# Patient Record
Sex: Female | Born: 1940 | Race: Black or African American | Hispanic: No | Marital: Single | Smoking: Former smoker
Health system: Southern US, Community
[De-identification: ages and names within clinical notes are randomized; demographics above are authoritative.]

## PROBLEM LIST (undated history)

## (undated) DIAGNOSIS — I2510 Atherosclerotic heart disease of native coronary artery without angina pectoris: Secondary | ICD-10-CM

## (undated) DIAGNOSIS — I10 Essential (primary) hypertension: Secondary | ICD-10-CM

## (undated) DIAGNOSIS — I639 Cerebral infarction, unspecified: Secondary | ICD-10-CM

---

## 2017-07-11 DIAGNOSIS — I10 Essential (primary) hypertension: Secondary | ICD-10-CM | POA: Diagnosis present

## 2017-07-11 DIAGNOSIS — I639 Cerebral infarction, unspecified: Secondary | ICD-10-CM | POA: Insufficient documentation

## 2017-07-11 DIAGNOSIS — Z8673 Personal history of transient ischemic attack (TIA), 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 residual deficits: Secondary | ICD-10-CM | POA: Insufficient documentation

## 2017-07-11 DIAGNOSIS — E785 Hyperlipidemia, unspecified: Secondary | ICD-10-CM | POA: Insufficient documentation

## 2017-12-04 DIAGNOSIS — Z87891 Personal history of nicotine dependence: Secondary | ICD-10-CM | POA: Insufficient documentation

## 2018-03-20 ENCOUNTER — Emergency Department: Payer: Medicare Other

## 2018-03-20 ENCOUNTER — Other Ambulatory Visit: Payer: Self-pay

## 2018-03-20 ENCOUNTER — Emergency Department
Admission: EM | Admit: 2018-03-20 | Discharge: 2018-03-20 | Disposition: A | Discharge status: Home / Self Care | Payer: Medicare Other | Attending: Internal Medicine | Admitting: Internal Medicine

## 2018-03-20 ENCOUNTER — Encounter: Payer: Self-pay | Admitting: Emergency Medicine

## 2018-03-20 DIAGNOSIS — I10 Essential (primary) hypertension: Secondary | ICD-10-CM | POA: Diagnosis not present

## 2018-03-20 DIAGNOSIS — R001 Bradycardia, unspecified: Secondary | ICD-10-CM | POA: Insufficient documentation

## 2018-03-20 DIAGNOSIS — Z79899 Other long term (current) drug therapy: Secondary | ICD-10-CM | POA: Insufficient documentation

## 2018-03-20 DIAGNOSIS — Z8673 Personal history of transient ischemic attack (TIA), 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 residual deficits: Secondary | ICD-10-CM | POA: Insufficient documentation

## 2018-03-20 DIAGNOSIS — Z87891 Personal history of nicotine dependence: Secondary | ICD-10-CM | POA: Diagnosis not present

## 2018-03-20 DIAGNOSIS — I2510 Atherosclerotic heart disease of native coronary artery without angina pectoris: Secondary | ICD-10-CM | POA: Diagnosis not present

## 2018-03-20 DIAGNOSIS — Z7982 Long term (current) use of aspirin: Secondary | ICD-10-CM | POA: Insufficient documentation

## 2018-03-20 HISTORY — DX: Atherosclerotic heart disease of native coronary artery without angina pectoris: I25.10

## 2018-03-20 HISTORY — DX: Cerebral infarction, unspecified: I63.9

## 2018-03-20 HISTORY — DX: Essential (primary) hypertension: I10

## 2018-03-20 LAB — CBC
HCT: 33.7 % — ABNORMAL LOW (ref 36.0–46.0)
Hemoglobin: 11.2 g/dL — ABNORMAL LOW (ref 12.0–15.0)
MCH: 29.3 pg (ref 26.0–34.0)
MCHC: 33.2 g/dL (ref 30.0–36.0)
MCV: 88.2 fL (ref 80.0–100.0)
NRBC: 0 % (ref 0.0–0.2)
Platelets: 240 10*3/uL (ref 150–400)
RBC: 3.82 MIL/uL — ABNORMAL LOW (ref 3.87–5.11)
RDW: 17 % — ABNORMAL HIGH (ref 11.5–15.5)
WBC: 4.7 10*3/uL (ref 4.0–10.5)

## 2018-03-20 LAB — URINALYSIS, COMPLETE (UACMP) WITH MICROSCOPIC
Bilirubin Urine: NEGATIVE
GLUCOSE, UA: NEGATIVE mg/dL
Hgb urine dipstick: NEGATIVE
Ketones, ur: NEGATIVE mg/dL
Leukocytes,Ua: NEGATIVE
Nitrite: NEGATIVE
PROTEIN: NEGATIVE mg/dL
Specific Gravity, Urine: 1.025 (ref 1.005–1.030)
pH: 6 (ref 5.0–8.0)

## 2018-03-20 LAB — BASIC METABOLIC PANEL
ANION GAP: 4 — AB (ref 5–15)
BUN: 19 mg/dL (ref 8–23)
CO2: 28 mmol/L (ref 22–32)
Calcium: 9.6 mg/dL (ref 8.9–10.3)
Chloride: 108 mmol/L (ref 98–111)
Creatinine, Ser: 1.02 mg/dL — ABNORMAL HIGH (ref 0.44–1.00)
GFR calc Af Amer: >60 mL/min (ref >60)
GFR calc non Af Amer: 52 mL/min — ABNORMAL LOW (ref >60)
Glucose, Bld: 117 mg/dL — ABNORMAL HIGH (ref 70–99)
Potassium: 3.7 mmol/L (ref 3.5–5.1)
Sodium: 140 mmol/L (ref 135–145)

## 2018-03-20 LAB — TSH: TSH: 1.096 u[IU]/mL (ref 0.350–4.500)

## 2018-03-20 LAB — TROPONIN I: Troponin I: <0.03 ng/mL (ref <0.03)

## 2018-03-20 NOTE — ED Triage Notes (Addendum)
Pt in via POV from [facility], family reports bradycardia and hypotension over the last two days, PCP advised to be seen here for possible echocardiogram.  Pt bradycardic upon arrival, other vitals WDL.  Pt denies any weakness, dizziness, or shortness of breath, also denies any pain.  NAD noted at this time.

## 2018-03-20 NOTE — ED Notes (Signed)
First nurse note: pt referred to ED by R. Smith PCP for bradycardia (40s, sinus brady per MD) and new-onset confusion.

## 2018-03-20 NOTE — ED Notes (Signed)
Urine specimen obtained and sent. Vss. Patient denies pain/sob. Awaiting disposition. Family at bedside.

## 2018-03-20 NOTE — ED Notes (Signed)
TSH added on to blood work in lab.

## 2018-03-20 NOTE — ED Notes (Signed)
Urine culture sent to lab along with UA

## 2018-03-20 NOTE — ED Notes (Signed)
Labs drawn in triage and resulted. patient placed on cardiac monitor showing brady cardia rate of 46 with inverted t waves. Denies pain/sob. Awaiting md plan of care. Family at bedside.n

## 2018-03-20 NOTE — ED Provider Notes (Signed)
[facility] Emergency Department Provider Note  ____________________________________________   I have reviewed the triage vital signs and the nursing notes. Where available I have reviewed prior notes and, if possible and indicated, outside hospital notes.    HISTORY  Chief Complaint Bradycardia    HPI Sara Bailey is a 78 y.o. female presents today complaining of "I feel fine".  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  She is not on any beta-blockers and I have called the nursing home to verify that.  She is here because she has a low heart rate.  Family has no concerns about how she is acting.  They thought her blood pressure was "either high or low or something" at the facility.  However, I did call the facility, and the nurse there told me her blood pressure was 145, and the only concern was that the stages noticed of bradycardia.  Patient has been at this facility only for a few days and her heart rate apparently has been low that entire time.  She does not have any chest pain shortness of breath nausea or vomiting, she is acting normally now respect she has a normal level of energy, she does not feel in any way unwell or ill, and she is adamant that she does not want to stay in the hospital.  There is a note that says she possibly had some confusion, asked patient and family about this.  They state that she has not been confused.  They state that there are 2 patients aides who have the same or similar name and she called 1 by the wrong name but otherwise there is been no confusion. All in the room agreed that she is at her baseline at this time.  Past Medical History:  Diagnosis Date  . Coronary artery disease   . Hypertension   . Stroke [facility])     There are no active problems to display for this patient.     Prior to Admission medications   Medication  Sig Start Date End Date Taking? Authorizing Provider  amLODipine (NORVASC) 5 MG tablet Take 5 mg by mouth daily. 03/05/18  Yes [provider]  aspirin 81 MG chewable tablet Chew 81 mg by mouth daily. 07/14/17 07/14/18 Yes [provider]  atorvastatin (LIPITOR) 80 MG tablet Take 80 mg by mouth daily. 03/05/18  Yes [provider]  hydrochlorothiazide (MICROZIDE) 12.5 MG capsule Take 12.5 mg by mouth daily. 02/05/18  Yes [provider]  levothyroxine (SYNTHROID, LEVOTHROID) 75 MCG tablet Take 75 mcg by mouth daily. 03/04/18  Yes [provider]  Melatonin 3 MG TABS Take 3 mg by mouth at bedtime.   Yes [provider]    Allergies Patient has no known allergies.  No family history on file.  Social History Social History   Tobacco Use  . Smoking status: Former Games developer  . Smokeless tobacco: Never Used  Substance Use Topics  . Alcohol use: Never    Frequency: Never  . Drug use: Never    Review of Systems Constitutional: No fever/chills Eyes: No visual changes. ENT: No sore throat. No stiff neck no neck pain Cardiovascular: Denies chest pain. Respiratory: Denies shortness of breath. Gastrointestinal:   no vomiting.  No diarrhea.  No constipation. Genitourinary: Negative for dysuria. Musculoskeletal: Negative lower extremity swelling Skin: Negative for rash. Neurological: Negative for severe headaches, focal weakness  or numbness.   ____________________________________________   PHYSICAL EXAM:  VITAL SIGNS: ED Triage Vitals  Enc Vitals Group     BP 03/20/18 1447 (!) 116/50     Pulse Rate 03/20/18 1447 (!) 53     Resp 03/20/18 1447 18     Temp 03/20/18 1447 98.2 F (36.8 C)     Temp Source 03/20/18 1447 Oral     SpO2 03/20/18 1447 100 %     Weight 03/20/18 1448 146 lb (66.2 kg)     Height 03/20/18 1448 5\' 6"  (1.676 m)     Head Circumference --      Peak Flow --      Pain Score 03/20/18 1456 0     Pain Loc --      Pain  Edu? --      Excl. in GC? --     Constitutional: Alert and oriented. Well appearing and in no acute distress. Eyes: Conjunctivae are normal Head: Atraumatic HEENT: No congestion/rhinnorhea. Mucous membranes are moist.  Oropharynx non-erythematous Neck:   Nontender with no meningismus, no masses, no stridor Cardiovascular: Bradycardia noted regular rhythm. Grossly normal heart sounds.  Good peripheral circulation. Respiratory: Normal respiratory effort.  No retractions. Lungs CTAB. Abdominal: Soft and nontender. No distention. No guarding no rebound Back:  There is no focal tenderness or step off.  there is no midline tenderness there are no lesions noted. there is no CVA tenderness Musculoskeletal: No lower extremity tenderness, no upper extremity tenderness. No joint effusions, no DVT signs strong distal pulses no edema Neurologic:  Normal speech and language. No gross focal neurologic deficits are appreciated.  Skin:  Skin is warm, dry and intact. No rash noted. Psychiatric: Mood and affect are normal. Speech and behavior are normal.  ____________________________________________   LABS (all labs ordered are listed, but only abnormal results are displayed)  Labs Reviewed  BASIC METABOLIC PANEL - Abnormal; Notable for the following components:      Result Value   Glucose, Bld 117 (*)    Creatinine, Ser 1.02 (*)    GFR calc non Af Amer 52 (*)    Anion gap 4 (*)    All other components within normal limits  CBC - Abnormal; Notable for the following components:   RBC 3.82 (*)    Hemoglobin 11.2 (*)    HCT 33.7 (*)    RDW 17.0 (*)    All other components within normal limits  TROPONIN I  TSH  URINALYSIS, COMPLETE (UACMP) WITH MICROSCOPIC  TROPONIN I    Pertinent labs  results that were available during my care of the patient were reviewed by me and considered in my medical decision making (see chart for details). ____________________________________________  EKG  I  personally interpreted any EKGs ordered by me or triage Bradycardia, diffuse ST changes, flipped T waves noted inferiorly and laterally no ST elevation, no ST depression, no old for comparison. ____________________________________________  RADIOLOGY  Pertinent labs & imaging results that were available during my care of the patient were reviewed by me and considered in my medical decision making (see chart for details). If possible, patient and/or family made aware of any abnormal findings.  No results found. ____________________________________________    PROCEDURES  Procedure(s) performed: None  Procedures  Critical Care performed: None  ____________________________________________   INITIAL IMPRESSION / ASSESSMENT AND PLAN / ED COURSE  Pertinent labs & imaging results that were available during my care of the patient were reviewed by me and considered in  my medical decision making (see chart for details).  Has had asymptomatic bradycardia for an unknown amount of time.  I have called the facility there is no chance in their opinion that she is taking the wrong medications as they are giving her her own medications that she brought with him to the facility.  She is new to this facility and they just noticed her heart rate being low since she got there.  She does not know what her heart rate was like before.  There is no other records available.  I did look through multiple different physical therapy notes going back to December but unfortunately they are not helpful.  The patient is in no acute distress and has no complaints.  I have offered and even suggested admission to the hospital even call the hospitalist to admit her because of her bradycardia of unknown etiology, and she refuses family are also in agreement that she should not be admitted.  I do not think this is unreasonable given that she has no symptoms and we have no idea how long this is been going on for but I did call  cardiology to check and see if they agree.  I talked to Dr. Lady GaryFath.  As usual I very much appreciate consult.  He states that at this time given asymptomatic bradycardia even with EKG findings and no old EKG for comparison, that they likely can see her tomorrow as an outpatient.  He does agree with thyroid testing repeat troponin prior to discharge.  We will do that.  Patient consents therefore to stay for the rest of her testing.  She has no complaints and her pressures been holding steady despite her heart rate.  In addition, I have told her to hold her blood pressure medication until she sees a cardiologist tomorrow.    ____________________________________________   FINAL CLINICAL IMPRESSION(S) / ED DIAGNOSES  Final diagnoses:  None      This chart was dictated using voice recognition software.  Despite best efforts to proofread,  errors can occur which can change meaning.      Jeanmarie PlantMcShane, James A, MD 03/20/18 1757

## 2018-03-20 NOTE — Discharge Instructions (Addendum)
You would prefer not to be admitted to the hospital which is certainly her choice but does limit our ability to watch you, as we have discussed.  For this reason, we asked that you be vigilant about your health, if you have chest pain, shortness of breath, you feel lightheaded or other concerns return to the emergency room.  Otherwise, we have talked to the cardiologist, and they will see you tomorrow morning.  Please call their office first thing in the morning for an appointment they should see you in late morning.  If you change your mind or feel worse in the meantime please return to the ER.  We would consider that you should stop taking her blood pressure medications, the amlodipine and the HCTZ, until you the heart doctor tomorrow.

## 2018-03-20 NOTE — ED Notes (Signed)
Awaiting chest xray

## 2018-03-22 LAB — URINE CULTURE: CULTURE: NO GROWTH

## 2018-06-15 ENCOUNTER — Encounter: Payer: Self-pay | Admitting: Emergency Medicine

## 2018-06-15 ENCOUNTER — Emergency Department
Admission: EM | Admit: 2018-06-15 | Discharge: 2018-06-15 | Disposition: A | Discharge status: Nursing Home (Includes ICF) | Payer: Medicare Other | Source: Home / Self Care | Attending: Emergency Medicine | Admitting: Emergency Medicine

## 2018-06-15 ENCOUNTER — Emergency Department: Payer: Medicare Other

## 2018-06-15 ENCOUNTER — Other Ambulatory Visit: Payer: Self-pay

## 2018-06-15 DIAGNOSIS — W19XXXA Unspecified fall, initial encounter: Secondary | ICD-10-CM | POA: Insufficient documentation

## 2018-06-15 DIAGNOSIS — I10 Essential (primary) hypertension: Secondary | ICD-10-CM | POA: Insufficient documentation

## 2018-06-15 DIAGNOSIS — I2510 Atherosclerotic heart disease of native coronary artery without angina pectoris: Secondary | ICD-10-CM | POA: Insufficient documentation

## 2018-06-15 DIAGNOSIS — R410 Disorientation, unspecified: Secondary | ICD-10-CM

## 2018-06-15 DIAGNOSIS — Z8673 Personal history of transient ischemic attack (TIA), 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 residual deficits: Secondary | ICD-10-CM | POA: Insufficient documentation

## 2018-06-15 DIAGNOSIS — Z79899 Other long term (current) drug therapy: Secondary | ICD-10-CM | POA: Insufficient documentation

## 2018-06-15 DIAGNOSIS — Z87891 Personal history of nicotine dependence: Secondary | ICD-10-CM | POA: Insufficient documentation

## 2018-06-15 DIAGNOSIS — E860 Dehydration: Secondary | ICD-10-CM | POA: Diagnosis not present

## 2018-06-15 LAB — COMPREHENSIVE METABOLIC PANEL
ALT: 21 U/L (ref 0–44)
AST: 22 U/L (ref 15–41)
Albumin: 4.2 g/dL (ref 3.5–5.0)
Alkaline Phosphatase: 84 U/L (ref 38–126)
Anion gap: 10 (ref 5–15)
BUN: 17 mg/dL (ref 8–23)
CO2: 27 mmol/L (ref 22–32)
Calcium: 10.4 mg/dL — ABNORMAL HIGH (ref 8.9–10.3)
Chloride: 102 mmol/L (ref 98–111)
Creatinine, Ser: 1.06 mg/dL — ABNORMAL HIGH (ref 0.44–1.00)
GFR calc Af Amer: 57 mL/min — ABNORMAL LOW (ref >60)
GFR calc non Af Amer: 50 mL/min — ABNORMAL LOW (ref >60)
Glucose, Bld: 117 mg/dL — ABNORMAL HIGH (ref 70–99)
Potassium: 3.3 mmol/L — ABNORMAL LOW (ref 3.5–5.1)
Sodium: 139 mmol/L (ref 135–145)
Total Bilirubin: 0.7 mg/dL (ref 0.3–1.2)
Total Protein: 7.9 g/dL (ref 6.5–8.1)

## 2018-06-15 LAB — CBC
HCT: 41 % (ref 36.0–46.0)
Hemoglobin: 13.7 g/dL (ref 12.0–15.0)
MCH: 29 pg (ref 26.0–34.0)
MCHC: 33.4 g/dL (ref 30.0–36.0)
MCV: 86.7 fL (ref 80.0–100.0)
Platelets: 263 10*3/uL (ref 150–400)
RBC: 4.73 MIL/uL (ref 3.87–5.11)
RDW: 15.5 % (ref 11.5–15.5)
WBC: 5.1 10*3/uL (ref 4.0–10.5)
nRBC: 0 % (ref 0.0–0.2)

## 2018-06-15 LAB — URINALYSIS, COMPLETE (UACMP) WITH MICROSCOPIC
Bilirubin Urine: NEGATIVE
Glucose, UA: NEGATIVE mg/dL
Hgb urine dipstick: NEGATIVE
Ketones, ur: NEGATIVE mg/dL
Nitrite: NEGATIVE
Protein, ur: NEGATIVE mg/dL
Specific Gravity, Urine: 1.015 (ref 1.005–1.030)
pH: 6 (ref 5.0–8.0)

## 2018-06-15 LAB — GLUCOSE, CAPILLARY: Glucose-Capillary: 71 mg/dL (ref 70–99)

## 2018-06-15 NOTE — ED Notes (Signed)
Patient's brother called, patient does not have key to apartment. Brother says door should be open but he cannot come unlock. EMS speaking with brother now

## 2018-06-15 NOTE — ED Notes (Signed)
Per Drinda Butts, patient's brother called and stated that he was not coming to get the patient and she needs to go back by ambulance. Ambulance was called. Patient has been informed.

## 2018-06-15 NOTE — ED Notes (Signed)
Left voicemail on Ben's cell (POA) about patients discharge

## 2018-06-15 NOTE — ED Provider Notes (Signed)
[facility] Emergency Department Provider Note    ____________________________________________   I have reviewed the triage vital signs and the nursing notes.   HISTORY  Chief Complaint Fall and Altered Mental Status   History limited by and level 5 caveat due to: dementia   HPI Sara Bailey is a 78 y.o. female who presents to the emergency department today because of concern for confusion and fall. The patient herself is unsure why she is in the emergency department. She does admit to having a fall yesterday. She says that she tripped. She denies any lightheadedness. She denies any chest pain or palpitations. She says that she has not had any recent illness. Denies any fever. No change in urination.   Records reviewed. Per medical record review patient has a history of CAD, HTN, stroke.   Past Medical History:  Diagnosis Date  . Coronary artery disease   . Hypertension   . Stroke [facility])     There are no active problems to display for this patient.   History reviewed. No pertinent surgical history.  Prior to Admission medications   Medication Sig Start Date End Date Taking? Authorizing Provider  amLODipine (NORVASC) 5 MG tablet Take 5 mg by mouth daily. 03/05/18   [provider]  aspirin 81 MG chewable tablet Chew 81 mg by mouth daily. 07/14/17 07/14/18  [provider]  atorvastatin (LIPITOR) 80 MG tablet Take 80 mg by mouth daily. 03/05/18   [provider]  hydrochlorothiazide (MICROZIDE) 12.5 MG capsule Take 12.5 mg by mouth daily. 02/05/18   [provider]  levothyroxine (SYNTHROID, LEVOTHROID) 75 MCG tablet Take 75 mcg by mouth daily. 03/04/18   [provider]  Melatonin 3 MG TABS Take 3 mg by mouth at bedtime.    [provider]    Allergies Patient has no known allergies.  No family history on file.  Social History Social History   Tobacco Use  . Smoking status: Former Games developermoker  .  Smokeless tobacco: Never Used  Substance Use Topics  . Alcohol use: Never    Frequency: Never  . Drug use: Never    Review of Systems Constitutional: No fever/chills Eyes: No visual changes. ENT: No sore throat. Cardiovascular: Denies chest pain. Respiratory: Denies shortness of breath. Gastrointestinal: No abdominal pain.  No nausea, no vomiting.  No diarrhea.   Genitourinary: Negative for dysuria. Musculoskeletal: Negative for back pain. Skin: Negative for rash. Neurological: Negative for headaches, focal weakness or numbness.  ____________________________________________   PHYSICAL EXAM:  VITAL SIGNS: ED Triage Vitals [06/15/18 1310]  Enc Vitals Group     BP (!) 147/89     Pulse Rate (!) 57     Resp 16     Temp 98.2 F (36.8 C)     Temp Source Oral     SpO2 97 %     Weight 158 lb (71.7 kg)     Height 5\' 6"  (1.676 m)     Head Circumference      Peak Flow      Pain Score 0   Constitutional: Alert and oriented.  Eyes: Conjunctivae are normal.  ENT      Head: Normocephalic and atraumatic.      Nose: No congestion/rhinnorhea.      Mouth/Throat: Mucous membranes are moist.      Neck: No stridor. Hematological/Lymphatic/Immunilogical: No cervical lymphadenopathy. Cardiovascular: Normal rate, regular rhythm.  No murmurs, rubs, or gallops. Respiratory: Normal respiratory effort without tachypnea nor retractions. Breath  sounds are clear and equal bilaterally. No wheezes/rales/rhonchi. Gastrointestinal: Soft and non tender. No rebound. No guarding.  Genitourinary: Deferred Musculoskeletal: Normal range of motion in all extremities. No lower extremity edema. Neurologic:  Normal speech and language. Not completely aware of events. No gross focal neurologic deficits are appreciated.  Skin:  Skin is warm, dry and intact. No rash noted. Psychiatric: Mood and affect are normal. Speech and behavior are normal. Patient exhibits appropriate insight and  judgment.  ____________________________________________    LABS (pertinent positives/negatives)  CBC wbc 5.1, hgb 13.7, plt 263 CMP na 139, k 3.3, glu 117, cr 1.06 ____________________________________________   EKG  I, Phineas Semen, attending physician, personally viewed and interpreted this EKG  EKG Time: 1547 Rate: 61 Rhythm: sinus rhythm Axis: left axis deviation Intervals: qtc 523 QRS: RBBB, LVH ST changes: no st elevation Impression: abnormal ekg   ____________________________________________    RADIOLOGY  CT head No acute abnormality  ____________________________________________   PROCEDURES  Procedures  ____________________________________________   INITIAL IMPRESSION / ASSESSMENT AND PLAN / ED COURSE  Pertinent labs & imaging results that were available during my care of the patient were reviewed by me and considered in my medical decision making (see chart for details).   Patient presented to the emergency department today after falling concerns for some confusion.  On exam here patient is somewhat disoriented to why she is at the hospital.  Head CT without any acute findings.  Patient is able to state clearly that she tripped during the fall.  Denied any chest pain or shortness of breath.  Denied any lightheadedness.  Patient's blood work without any concerning findings.  Patient's urine did have some trace leukocytes however no white blood cells.  Will send for culture.  This point do think is reasonable for patient to return to living facility. ____________________________________________   FINAL CLINICAL IMPRESSION(S) / ED DIAGNOSES  Final diagnoses:  Fall, initial encounter  Confusion     Note: This dictation was prepared with Dragon dictation. Any transcriptional errors that result from this process are unintentional     Phineas Semen, MD 06/15/18 [phone]

## 2018-06-15 NOTE — ED Notes (Signed)
Multiple attempts at calling [facility] with no success

## 2018-06-15 NOTE — Discharge Instructions (Addendum)
Please seek medical attention for any high fevers, chest pain, shortness of breath, change in behavior, persistent vomiting, bloody stool or any other new or concerning symptoms.  

## 2018-06-15 NOTE — ED Triage Notes (Signed)
Patient presents to the ED via EMS from [facility] for confusion.  EMS was called out by staff because patient seemed more confused than normal per staff and patient's brother.  Patient is alert and oriented to self and place at this time but not time.  Per EMS patient was not oriented to place or time earlier today.  Per Staff, they felt patient may have fallen during the night because her closet was "off it's tracks".  Patient reports falling yesterday, denies hitting her head.  Patient states, "I tripped and then I fell."

## 2018-06-17 ENCOUNTER — Other Ambulatory Visit: Payer: Self-pay

## 2018-06-17 ENCOUNTER — Emergency Department: Payer: Medicare Other

## 2018-06-17 ENCOUNTER — Inpatient Hospital Stay
Admission: EM | Admit: 2018-06-17 | Discharge: 2018-06-21 | DRG: 640 | Disposition: A | Discharge status: Skilled Nursing Facility | Payer: Medicare Other | Attending: Internal Medicine | Admitting: Internal Medicine

## 2018-06-17 DIAGNOSIS — Z9181 History of falling: Secondary | ICD-10-CM | POA: Diagnosis not present

## 2018-06-17 DIAGNOSIS — E782 Mixed hyperlipidemia: Secondary | ICD-10-CM | POA: Diagnosis present

## 2018-06-17 DIAGNOSIS — I129 Hypertensive chronic kidney disease with stage 1 through stage 4 chronic kidney disease, or unspecified chronic kidney disease: Secondary | ICD-10-CM | POA: Diagnosis present

## 2018-06-17 DIAGNOSIS — I248 Other forms of acute ischemic heart disease: Secondary | ICD-10-CM | POA: Diagnosis present

## 2018-06-17 DIAGNOSIS — N183 Chronic kidney disease, stage 3 (moderate): Secondary | ICD-10-CM | POA: Diagnosis present

## 2018-06-17 DIAGNOSIS — E876 Hypokalemia: Secondary | ICD-10-CM | POA: Diagnosis present

## 2018-06-17 DIAGNOSIS — G9341 Metabolic encephalopathy: Secondary | ICD-10-CM | POA: Diagnosis present

## 2018-06-17 DIAGNOSIS — E860 Dehydration: Secondary | ICD-10-CM | POA: Diagnosis present

## 2018-06-17 DIAGNOSIS — Z87891 Personal history of nicotine dependence: Secondary | ICD-10-CM

## 2018-06-17 DIAGNOSIS — R4182 Altered mental status, unspecified: Secondary | ICD-10-CM | POA: Diagnosis present

## 2018-06-17 DIAGNOSIS — I10 Essential (primary) hypertension: Secondary | ICD-10-CM

## 2018-06-17 DIAGNOSIS — I739 Peripheral vascular disease, unspecified: Secondary | ICD-10-CM | POA: Diagnosis present

## 2018-06-17 DIAGNOSIS — Z8673 Personal history of transient ischemic attack (TIA), 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 residual deficits: Secondary | ICD-10-CM

## 2018-06-17 DIAGNOSIS — F0390 Unspecified dementia without behavioral disturbance: Secondary | ICD-10-CM | POA: Diagnosis present

## 2018-06-17 DIAGNOSIS — I2510 Atherosclerotic heart disease of native coronary artery without angina pectoris: Secondary | ICD-10-CM | POA: Diagnosis present

## 2018-06-17 DIAGNOSIS — Z20828 Contact with and (suspected) exposure to other viral communicable diseases: Secondary | ICD-10-CM | POA: Diagnosis present

## 2018-06-17 DIAGNOSIS — Z23 Encounter for immunization: Secondary | ICD-10-CM

## 2018-06-17 DIAGNOSIS — E039 Hypothyroidism, unspecified: Secondary | ICD-10-CM | POA: Diagnosis present

## 2018-06-17 LAB — TSH: TSH: 1.927 u[IU]/mL (ref 0.350–4.500)

## 2018-06-17 LAB — URINALYSIS, COMPLETE (UACMP) WITH MICROSCOPIC
Bilirubin Urine: NEGATIVE
Glucose, UA: NEGATIVE mg/dL
Ketones, ur: 20 mg/dL — AB
Leukocytes,Ua: NEGATIVE
Nitrite: NEGATIVE
Protein, ur: 30 mg/dL — AB
Specific Gravity, Urine: 1.019 (ref 1.005–1.030)
Squamous Epithelial / HPF: NONE SEEN (ref 0–5)
pH: 5 (ref 5.0–8.0)

## 2018-06-17 LAB — COMPREHENSIVE METABOLIC PANEL
ALT: 18 U/L (ref 0–44)
AST: 26 U/L (ref 15–41)
Albumin: 4.4 g/dL (ref 3.5–5.0)
Alkaline Phosphatase: 112 U/L (ref 38–126)
Anion gap: 14 (ref 5–15)
BUN: 23 mg/dL (ref 8–23)
CO2: 26 mmol/L (ref 22–32)
Calcium: 10.9 mg/dL — ABNORMAL HIGH (ref 8.9–10.3)
Chloride: 106 mmol/L (ref 98–111)
Creatinine, Ser: 1.17 mg/dL — ABNORMAL HIGH (ref 0.44–1.00)
GFR calc Af Amer: 51 mL/min — ABNORMAL LOW (ref >60)
GFR calc non Af Amer: 44 mL/min — ABNORMAL LOW (ref >60)
Glucose, Bld: 119 mg/dL — ABNORMAL HIGH (ref 70–99)
Potassium: 3.4 mmol/L — ABNORMAL LOW (ref 3.5–5.1)
Sodium: 146 mmol/L — ABNORMAL HIGH (ref 135–145)
Total Bilirubin: 1.2 mg/dL (ref 0.3–1.2)
Total Protein: 8.2 g/dL — ABNORMAL HIGH (ref 6.5–8.1)

## 2018-06-17 LAB — CBC WITH DIFFERENTIAL/PLATELET
Abs Immature Granulocytes: 0.03 10*3/uL (ref 0.00–0.07)
Basophils Absolute: 0 10*3/uL (ref 0.0–0.1)
Basophils Relative: 0 %
Eosinophils Absolute: 0 10*3/uL (ref 0.0–0.5)
Eosinophils Relative: 0 %
HCT: 44 % (ref 36.0–46.0)
Hemoglobin: 14.7 g/dL (ref 12.0–15.0)
Immature Granulocytes: 0 %
Lymphocytes Relative: 14 %
Lymphs Abs: 1.5 10*3/uL (ref 0.7–4.0)
MCH: 28.9 pg (ref 26.0–34.0)
MCHC: 33.4 g/dL (ref 30.0–36.0)
MCV: 86.6 fL (ref 80.0–100.0)
Monocytes Absolute: 0.8 10*3/uL (ref 0.1–1.0)
Monocytes Relative: 7 %
Neutro Abs: 8.2 10*3/uL — ABNORMAL HIGH (ref 1.7–7.7)
Neutrophils Relative %: 79 %
Platelets: 236 10*3/uL (ref 150–400)
RBC: 5.08 MIL/uL (ref 3.87–5.11)
RDW: 15.6 % — ABNORMAL HIGH (ref 11.5–15.5)
WBC: 10.6 10*3/uL — ABNORMAL HIGH (ref 4.0–10.5)
nRBC: 0 % (ref 0.0–0.2)

## 2018-06-17 LAB — AMMONIA: Ammonia: <9 umol/L — ABNORMAL LOW (ref 9–35)

## 2018-06-17 LAB — URINE CULTURE: Culture: 50000 — AB

## 2018-06-17 LAB — FOLATE: Folate: 12.1 ng/mL (ref >5.9)

## 2018-06-17 LAB — TROPONIN I
Troponin I: 0.04 ng/mL (ref <0.03)
Troponin I: 0.07 ng/mL (ref <0.03)

## 2018-06-17 LAB — SARS CORONAVIRUS 2 BY RT PCR (HOSPITAL ORDER, PERFORMED IN ~~LOC~~ HOSPITAL LAB): SARS Coronavirus 2: NEGATIVE

## 2018-06-17 MED ORDER — HYDROCHLOROTHIAZIDE 25 MG PO TABS
25.0000 mg | ORAL_TABLET | Freq: Every day | ORAL | Status: DC
  Administered 2018-06-18 – 2018-06-21 (×4): 25 mg via ORAL
  Filled 2018-06-17 (×4): qty 1

## 2018-06-17 MED ORDER — ONDANSETRON HCL 4 MG/2ML IJ SOLN: 4.0000 mg | Freq: Four times a day (QID) | INTRAMUSCULAR | Status: DC | PRN

## 2018-06-17 MED ORDER — ATORVASTATIN CALCIUM 20 MG PO TABS
80.0000 mg | ORAL_TABLET | Freq: Every day | ORAL | Status: DC
  Administered 2018-06-18 – 2018-06-21 (×4): 80 mg via ORAL
  Filled 2018-06-17 (×4): qty 4

## 2018-06-17 MED ORDER — MELATONIN 5 MG PO TABS
5.0000 mg | ORAL_TABLET | Freq: Every day | ORAL | Status: DC
  Administered 2018-06-19 – 2018-06-20 (×2): 5 mg via ORAL
  Filled 2018-06-17 (×5): qty 1

## 2018-06-17 MED ORDER — SODIUM CHLORIDE 0.9 % IV BOLUS
1000.0000 mL | Freq: Once | INTRAVENOUS | Status: AC
  Administered 2018-06-17: 18:00:00 1000 mL via INTRAVENOUS

## 2018-06-17 MED ORDER — ONDANSETRON HCL 4 MG PO TABS: 4.0000 mg | ORAL_TABLET | Freq: Four times a day (QID) | ORAL | Status: DC | PRN

## 2018-06-17 MED ORDER — SODIUM CHLORIDE 0.45 % IV SOLN
INTRAVENOUS | Status: AC
  Administered 2018-06-17: 21:00:00 via INTRAVENOUS

## 2018-06-17 MED ORDER — SODIUM CHLORIDE 0.9 % IV SOLN: INTRAVENOUS | Status: DC

## 2018-06-17 MED ORDER — MIRTAZAPINE 15 MG PO TABS
7.5000 mg | ORAL_TABLET | Freq: Every day | ORAL | Status: DC
  Administered 2018-06-17 – 2018-06-20 (×4): 7.5 mg via ORAL
  Filled 2018-06-17 (×4): qty 1

## 2018-06-17 MED ORDER — POTASSIUM CHLORIDE 10 MEQ/100ML IV SOLN
10.0000 meq | INTRAVENOUS | Status: AC
  Administered 2018-06-17 – 2018-06-18 (×4): 10 meq via INTRAVENOUS
  Filled 2018-06-17 (×4): qty 100

## 2018-06-17 MED ORDER — ENOXAPARIN SODIUM 40 MG/0.4ML ~~LOC~~ SOLN
40.0000 mg | SUBCUTANEOUS | Status: DC
  Administered 2018-06-17 – 2018-06-20 (×4): 40 mg via SUBCUTANEOUS
  Filled 2018-06-17 (×4): qty 0.4

## 2018-06-17 MED ORDER — LEVOTHYROXINE SODIUM 50 MCG PO TABS
75.0000 ug | ORAL_TABLET | Freq: Every day | ORAL | Status: DC
  Administered 2018-06-18 – 2018-06-21 (×4): 75 ug via ORAL
  Filled 2018-06-17 (×4): qty 2

## 2018-06-17 MED ORDER — LATANOPROST 0.005 % OP SOLN
1.0000 [drp] | Freq: Every day | OPHTHALMIC | Status: DC
  Administered 2018-06-17 – 2018-06-20 (×4): 1 [drp] via OPHTHALMIC
  Filled 2018-06-17 (×2): qty 2.5

## 2018-06-17 MED ORDER — HYDRALAZINE HCL 20 MG/ML IJ SOLN
10.0000 mg | Freq: Once | INTRAMUSCULAR | Status: AC
  Administered 2018-06-17: 16:00:00 10 mg via INTRAVENOUS
  Filled 2018-06-17: qty 1

## 2018-06-17 MED ORDER — ACETAMINOPHEN 650 MG RE SUPP: 650.0000 mg | Freq: Four times a day (QID) | RECTAL | Status: DC | PRN

## 2018-06-17 MED ORDER — AMLODIPINE BESYLATE 5 MG PO TABS
10.0000 mg | ORAL_TABLET | Freq: Every day | ORAL | Status: DC
  Administered 2018-06-18 – 2018-06-21 (×4): 10 mg via ORAL
  Filled 2018-06-17 (×4): qty 2

## 2018-06-17 MED ORDER — HYDRALAZINE HCL 20 MG/ML IJ SOLN: 5.0000 mg | INTRAMUSCULAR | Status: DC | PRN

## 2018-06-17 MED ORDER — POTASSIUM CHLORIDE CRYS ER 20 MEQ PO TBCR
40.0000 meq | EXTENDED_RELEASE_TABLET | Freq: Once | ORAL | Status: DC
  Filled 2018-06-17: qty 2

## 2018-06-17 MED ORDER — POLYETHYLENE GLYCOL 3350 17 G PO PACK: 17.0000 g | PACK | Freq: Every day | ORAL | Status: DC | PRN

## 2018-06-17 MED ORDER — ASPIRIN 81 MG PO CHEW
81.0000 mg | CHEWABLE_TABLET | Freq: Every day | ORAL | Status: DC
  Administered 2018-06-18 – 2018-06-21 (×4): 81 mg via ORAL
  Filled 2018-06-17 (×4): qty 1

## 2018-06-17 MED ORDER — HYDRALAZINE HCL 20 MG/ML IJ SOLN
5.0000 mg | Freq: Once | INTRAMUSCULAR | Status: AC
  Administered 2018-06-17: 15:00:00 5 mg via INTRAVENOUS
  Filled 2018-06-17: qty 1

## 2018-06-17 MED ORDER — ACETAMINOPHEN 325 MG PO TABS: 650.0000 mg | ORAL_TABLET | Freq: Four times a day (QID) | ORAL | Status: DC | PRN

## 2018-06-17 NOTE — ED Notes (Signed)
Lab states they got the blood they needed

## 2018-06-17 NOTE — ED Notes (Addendum)
Informed that lab had called and stated they needed a redraw on green and purple top as they had hemolyzed- will redraw

## 2018-06-17 NOTE — ED Notes (Signed)
This RN called lab to see if IV team drew blood. Gwen from lab stated that she has not received any blood from IV team on this pt. Will attempt to draw blood off pt line that IV team started.

## 2018-06-17 NOTE — ED Notes (Signed)
Pt given yellow socks, yellow armband, door open and oriented to use call bell if needed

## 2018-06-17 NOTE — ED Notes (Signed)
Lab at bedside

## 2018-06-17 NOTE — Progress Notes (Signed)
Dr. Anne Hahn notified of Na+ 146; acknowledged; new order writtten for 0.45 NS. Windy Carina, RN 9:02 PM 06/17/2018

## 2018-06-17 NOTE — ED Notes (Signed)
Patient transported to CT 

## 2018-06-17 NOTE — ED Notes (Addendum)
Went to check on pt and pt had thrown up on herself and wet the bed- Jae Dire RN helped to get her clean and dry- new sheets and gown placed on pt- brief placed on pt

## 2018-06-17 NOTE — ED Notes (Addendum)
Talked to brother- he is concerned that the pt had a stroke even though it was explained to him that the CT scan did not show anything- he now wants her kept for observation and wishes to speak with the doctor- Dr Scotty Court notified and will speak with the brother

## 2018-06-17 NOTE — ED Notes (Signed)
Another RN attempted to draw blood x 2, unsuccessful. Called Brandy, Consulting civil engineer, who stated to call lab to have them come draw blood. This RN called lab and spoke to Parker.

## 2018-06-17 NOTE — ED Notes (Signed)
Carollee Herter RN attempted [place] IV insertion, unsuccessfull. Will wait for IV team to attempt.

## 2018-06-17 NOTE — ED Notes (Signed)
This RN called lab d/t blood being sent but not processed yet. Stated "we were waiting on a blue top." informed lab that test was cancelled. Lab stated they would run the labs now.

## 2018-06-17 NOTE — ED Notes (Signed)
Spoke with pt's brother and gave him an update on pt condition and care

## 2018-06-17 NOTE — ED Notes (Signed)
Jae Dire RN attempted x3 for an IV

## 2018-06-17 NOTE — Progress Notes (Signed)
Hospitalist paged for Na+ 146; patient on NS @ 75 ml/hour; awaiting callback. Windy Carina, RN 8:59 PM 06/17/2018

## 2018-06-17 NOTE — ED Provider Notes (Addendum)
[facility] Emergency Department Provider Note ____________________________________________   First MD Initiated Contact with Patient 06/17/18 1150     (approximate)  I have reviewed the triage vital signs and the nursing notes.   HISTORY  Chief Complaint Altered Mental Status  Level 5 caveat: History of present illness limited due to altered mental status  HPI Sara Bailey is a 78 y.o. female with PMH as noted below including CAD, hypertension, and stroke who presents with altered mental status.  Normally the patient is able to ambulate and talk, but over the last 2 days she has been behaving strangely and appearing confused.  The patient denies any acute pain and does not know why she is here.  Past Medical History:  Diagnosis Date  . Coronary artery disease   . Hypertension   . Stroke [facility])     There are no active problems to display for this patient.   History reviewed. No pertinent surgical history.  Prior to Admission medications   Medication Sig Start Date End Date Taking? Authorizing Provider  amLODipine (NORVASC) 5 MG tablet Take 5 mg by mouth daily. 03/05/18  Yes [provider]  aspirin 81 MG chewable tablet Chew 81 mg by mouth daily. 07/14/17 07/14/18 Yes [provider]  atorvastatin (LIPITOR) 80 MG tablet Take 80 mg by mouth daily. 03/05/18  Yes [provider]  hydrochlorothiazide (MICROZIDE) 12.5 MG capsule Take 12.5 mg by mouth daily. 02/05/18  Yes [provider]  latanoprost (XALATAN) 0.005 % ophthalmic solution Apply 1 drop to eye at bedtime. 01/12/18  Yes [provider]  levothyroxine (SYNTHROID, LEVOTHROID) 75 MCG tablet Take 75 mcg by mouth daily. 03/04/18  Yes [provider]  Melatonin 3 MG TABS Take 3 mg by mouth at bedtime.   Yes [provider]  mirtazapine (REMERON) 7.5 MG tablet Take 7.5 mg by mouth at bedtime.  06/14/18  Yes [provider]    Allergies  Patient has no known allergies.  No family history on file.  Social History Social History   Tobacco Use  . Smoking status: Former Games developer  . Smokeless tobacco: Never Used  Substance Use Topics  . Alcohol use: Never    Frequency: Never  . Drug use: Never    Review of Systems Level 5 caveat: Unable to obtain review of systems due to altered mental status    ____________________________________________   PHYSICAL EXAM:  VITAL SIGNS: ED Triage Vitals  Enc Vitals Group     BP 06/17/18 1126 (!) 211/97     Pulse Rate 06/17/18 1126 70     Resp 06/17/18 1126 17     Temp 06/17/18 1126 98.6 F (37 C)     Temp Source 06/17/18 1126 Oral     SpO2 06/17/18 1126 97 %     Weight 06/17/18 1127 125 lb (56.7 kg)     Height 06/17/18 1127 5\' 6"  (1.676 m)     Head Circumference --      Peak Flow --      Pain Score 06/17/18 1126 0     Pain Loc --      Pain Edu? --      Excl. in GC? --     Constitutional: Alert, oriented to person only.  Comfortable appearing.  Eyes: Conjunctivae are normal.  EOMI.  PERRLA. Head: Atraumatic. Nose: No congestion/rhinnorhea. Mouth/Throat: Mucous membranes are slightly dry.   Neck: Normal range of motion.  Cardiovascular: Normal rate, regular rhythm. Grossly normal  heart sounds.  Good peripheral circulation. Respiratory: Normal respiratory effort.  No retractions. Lungs CTAB. Gastrointestinal: Soft and nontender. No distention.  Genitourinary: No flank tenderness. Musculoskeletal: No lower extremity edema.  Extremities warm and well perfused.  Neurologic: Normal speech.  Motor intact in all extremities. Skin:  Skin is warm and dry. No rash noted. Psychiatric: Calm and cooperative.  ____________________________________________   LABS (all labs ordered are listed, but only abnormal results are displayed)  Labs Reviewed  URINALYSIS, COMPLETE (UACMP) WITH MICROSCOPIC - Abnormal; Notable for the following components:      Result Value   Color,  Urine YELLOW (*)    APPearance CLEAR (*)    Hgb urine dipstick SMALL (*)    Ketones, ur 20 (*)    Protein, ur 30 (*)    Bacteria, UA RARE (*)    All other components within normal limits  SARS CORONAVIRUS 2 (HOSPITAL ORDER, PERFORMED IN [facility])  CBC WITH DIFFERENTIAL/PLATELET  CBC WITH DIFFERENTIAL/PLATELET  COMPREHENSIVE METABOLIC PANEL  TROPONIN I  CBG MONITORING, ED   ____________________________________________  EKG  ED ECG REPORT I, Dionne BucySebastian Larren Copes, the attending physician, personally viewed and interpreted this ECG.  Date: 06/17/2018 EKG Time: 1354 Rate: 75 Rhythm: normal sinus rhythm with PVCs QRS Axis: normal Intervals: RBBB, repolarization abnormality ST/T Wave abnormalities: Nonspecific ST abnormalities Narrative Interpretation: Nonspecific abnormalities with evidence of acute ischemia  ____________________________________________  RADIOLOGY  CT head: No acute abnormality CXR: No focal infiltrate  ____________________________________________   PROCEDURES  Procedure(s) performed: No  Procedures  Critical Care performed: No ____________________________________________   INITIAL IMPRESSION / ASSESSMENT AND PLAN / ED COURSE  Pertinent labs & imaging results that were available during my care of the patient were reviewed by me and considered in my medical decision making (see chart for details).  78 year old female with PMH as noted above presents with altered mental status over the last 2 days.  I reviewed the past medical records in Epic.  The patient was seen in the ED on 06/15/2018 with confusion and a fall.  CT head at that time showed no acute abnormality.  On exam today, the patient's mental status is significantly worse than it was when she was here 2 days ago.  She is oriented to person only, and appears confused.  She is not really able to answer most of my questions.  Neuro exam is nonfocal.  There is no visible trauma.   The remainder of the exam is as described above.  Differential is broad but includes UTI or other infection, dehydration or other metabolic etiology, or possible ICH or other CNS cause.  Will obtain CT head, chest x-ray, lab work-up, UA, and reassess.  ----------------------------------------- 3:24 PM on 06/17/2018 -----------------------------------------  Lab work-up slightly delayed as the patient had very difficult IV access.  Most of her labs are still pending.  The patient's blood pressure is somewhat improved after labetalol although it continues to be elevated.  I am signing the patient out to the oncoming physician Dr. Scotty CourtStafford.  Anticipate she will likely need admission. ____________________________________________   FINAL CLINICAL IMPRESSION(S) / ED DIAGNOSES  Final diagnoses:  Altered mental status, unspecified altered mental status type  Hypertension, unspecified type      NEW MEDICATIONS STARTED DURING THIS VISIT:  New Prescriptions   No medications on file     Note:  This document was prepared using Dragon voice recognition software and may include unintentional dictation errors.    Dionne BucySiadecki, Sherrell Weir, MD 06/17/18 1524  Dionne Bucy, MD 06/17/18 1534

## 2018-06-17 NOTE — Progress Notes (Signed)
Patient doesn't open eyes when spoken to; slow to respond after repeated attempts at communication; stated her name only, but will not answer anymore questions, nor take anymore medications; shaking head when attempted po's and getting combative; Dr. Anne Hahn notified of refusal of KDur; acknowledged; new order written for IV potassium. Windy Carina, RN 10:44 PM 06/17/2018

## 2018-06-17 NOTE — H&P (Addendum)
[facility] - [place] [facility]   PATIENT NAME: Sara Bailey    MR#:  [id number]  DATE OF BIRTH:  [birth year]  DATE OF ADMISSION:  06/17/2018  PRIMARY CARE PHYSICIAN: [facility]   REQUESTING/REFERRING PHYSICIAN: Alfonse Flavors, MD  CHIEF COMPLAINT:   Chief Complaint  Patient presents with   Altered Mental Status    HISTORY OF PRESENT ILLNESS:  Sara Bailey  is a 78 y.o. female with a known history of CAD, hypertension, history of stroke who presented to the ED with altered mental status for the last 3 or 4 days.  Unable to obtain much history from the patient, so history provided by ED physician and family.  Patient currently living at independent living facility and has had inability to care for herself over the last couple of days due to altered mental status.  She denies any chest pain, shortness of breath, abdominal pain, fevers, chills.  In the ED, she was hypertensive with BP 213/110.  Labs were significant for K 3.4, tropes 0.04, WBC 10.6.  UA with 20 ketones.  Chest x-ray negative.  CT head negative.  COVID test was negative.  Hospitalists were called for admission.  PAST MEDICAL HISTORY:   Past Medical History:  Diagnosis Date   Coronary artery disease    Hypertension    Stroke [facility])     PAST SURGICAL HISTORY:  History reviewed. No pertinent surgical history.  SOCIAL HISTORY:   Social History   Tobacco Use   Smoking status: Former Smoker   Smokeless tobacco: Never Used  Substance Use Topics   Alcohol use: Never    Frequency: Never    FAMILY HISTORY:  Unable to obtain due to altered mental status  DRUG ALLERGIES:  No Known Allergies  REVIEW OF SYSTEMS:   ROS-unable to obtain due to altered mental status  MEDICATIONS AT HOME:   Prior to Admission medications   Medication Sig Start Date End Date Taking? Authorizing Provider  amLODipine (NORVASC) 5 MG tablet Take 5 mg by mouth daily. 03/05/18  Yes  [provider]  aspirin 81 MG chewable tablet Chew 81 mg by mouth daily. 07/14/17 07/14/18 Yes [provider]  atorvastatin (LIPITOR) 80 MG tablet Take 80 mg by mouth daily. 03/05/18  Yes [provider]  hydrochlorothiazide (MICROZIDE) 12.5 MG capsule Take 12.5 mg by mouth daily. 02/05/18  Yes [provider]  latanoprost (XALATAN) 0.005 % ophthalmic solution Apply 1 drop to eye at bedtime. 01/12/18  Yes [provider]  levothyroxine (SYNTHROID, LEVOTHROID) 75 MCG tablet Take 75 mcg by mouth daily. 03/04/18  Yes [provider]  Melatonin 3 MG TABS Take 3 mg by mouth at bedtime.   Yes [provider]  mirtazapine (REMERON) 7.5 MG tablet Take 7.5 mg by mouth at bedtime.  06/14/18  Yes [provider]      VITAL SIGNS:  Blood pressure (!) 144/85, pulse 91, temperature 98.6 F (37 C), temperature source Oral, resp. rate (!) 21, height 5\' 6"  (1.676 m), weight 56.7 kg, SpO2 96 %.  PHYSICAL EXAMINATION:  Physical Exam  GENERAL:  78 y.o.-year-old patient lying in the bed with no acute distress.  Thin appearing. EYES: Pupils equal, round, reactive to light and accommodation. No scleral icterus. Extraocular muscles intact.  HEENT: Head atraumatic, normocephalic. Oropharynx and nasopharynx clear.  NECK:  Supple, no jugular venous distention. No thyroid enlargement, no tenderness.  LUNGS: + Minutes breath sounds in lung bases bilaterally., no wheezing, rales,rhonchi  or crepitation. No use of accessory muscles of respiration.  CARDIOVASCULAR: RRR, S1, S2 normal. No murmurs, rubs, or gallops.  ABDOMEN: Soft, nontender, nondistended. Bowel sounds present. No organomegaly or mass.  EXTREMITIES: No pedal edema, cyanosis, or clubbing.  NEUROLOGIC: Cranial nerves II through XII are intact. + Global weakness. Sensation intact. Gait not checked.  PSYCHIATRIC: The patient is alert and oriented only to self. SKIN: No obvious rash, lesion, or  ulcer.   LABORATORY PANEL:   CBC Recent Labs  Lab 06/17/18 1545  WBC 10.6*  HGB 14.7  HCT 44.0  PLT 236   ------------------------------------------------------------------------------------------------------------------  Chemistries  Recent Labs  Lab 06/17/18 1545  NA 146*  K 3.4*  CL 106  CO2 26  GLUCOSE 119*  BUN 23  CREATININE 1.17*  CALCIUM 10.9*  AST 26  ALT 18  ALKPHOS 112  BILITOT 1.2   ------------------------------------------------------------------------------------------------------------------  Cardiac Enzymes Recent Labs  Lab 06/17/18 1545  TROPONINI 0.04*   ------------------------------------------------------------------------------------------------------------------  RADIOLOGY:  Ct Head Wo Contrast  Result Date: 06/17/2018 CLINICAL DATA:  Altered mental status EXAM: CT HEAD WITHOUT CONTRAST TECHNIQUE: Contiguous axial images were obtained from the base of the skull through the vertex without intravenous contrast. COMPARISON:  06/15/2018 FINDINGS: Brain: Similar findings of atrophy with sulcal prominence and centralized volume loss with commensurate ex vacuo dilatation of the ventricular system, left slightly greater than right. Nearly confluent periventricular hypodensities compatible with microvascular ischemic disease. Old lacunar infarct within the left basal ganglia. Given extensive background parenchymal abnormalities, there is no CT evidence superimposed acute large territory infarct. No intraparenchymal or extra-axial mass or hemorrhage. Unchanged size and configuration of the ventricles and the basilar cisterns. 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 No midline shift. Vascular: Intracranial atherosclerosis. Skull: No displaced calvarial fracture. Sinuses/Orbits: Scattered opacification of the left anterior ethmoidal air cells. The remaining paranasal sinuses and mastoid air cells are normally aerated. No air-fluid levels. Other:  Regional soft tissues appear normal. IMPRESSION: 1. Similar findings of atrophy and microvascular ischemic disease without superimposed acute intracranial process. 2. Sinus disease above.  No air-fluid levels. Electronically Signed   By: Simonne ComeJohn  Watts M.D.   On: 06/17/2018 13:25   Dg Chest Portable 1 View  Result Date: 06/17/2018 CLINICAL DATA:  Altered mental status. EXAM: PORTABLE CHEST 1 VIEW COMPARISON:  03/20/2018 FINDINGS: Grossly unchanged enlarged cardiac silhouette and mediastinal contours. Atherosclerotic plaque when the thoracic aorta. The lungs remain hyperexpanded with flattening the diaphragms mild diffuse slightly nodular thickening of the pulmonary interstitium. Bibasilar heterogeneous opacities are unchanged favored to represent atelectasis or scar. Unchanged symmetric biapical pleuroparenchymal thickening. No new focal airspace opacities. No pleural effusion or pneumothorax. No acute or aggressive osseous abnormalities. IMPRESSION: Similar findings of lung hyperexpansion and chronic bronchitic change without superimposed acute cardiopulmonary disease. Electronically Signed   By: Simonne ComeJohn  Watts M.D.   On: 06/17/2018 13:26      IMPRESSION AND PLAN:   Altered mental status- unclear etiology. May just be a worsening of her baseline dementia.  No signs of infection.  CT head negative. -Check HIV, RPR, TSH, ammonia, B12, folate to rule out other causes of AMS  Uncontrolled hypertension- BPs markedly elevated in the ED -Increase home Norvasc to 10 mg daily and HCTZ to 25 mg daily -Hydralazine IV as needed  Mildly elevated troponin- unlikely ACS. No active chest pain. -Trend troponins  Hypokalemia -Replete and recheck  Hypothyroidism-stable -Continue home Synthroid -Check TSH  CKD 3- creatinine close to baseline. -Avoid nephrotoxic agents -  Monitor  History of stroke- no focal neuro deficits to suggest new stroke -Continue aspirin and Lipitor   All the records are reviewed and  case discussed with ED provider. Management plans discussed with the patient, family and they are in agreement.  CODE STATUS: Full code for now- patient is altered and unable to get in touch with family. Need to readdress in the morning.  TOTAL TIME TAKING CARE OF THIS PATIENT: 45 minutes.    Jinny Blossom Cathleen Yagi M.D on 06/17/2018 at 6:01 PM  Between 7am to 6pm - Pager - [phone]  After 6pm go to [email or web address] - Social research officer, government  [facility]  Office  [phone]  CC: Primary care physician; [facility]   Note: This dictation was prepared with Dragon dictation along with smaller phrase technology. Any transcriptional errors that result from this process are unintentional.

## 2018-06-17 NOTE — ED Notes (Signed)
This RN attempted IV access. Veins blew. Was able to obtain light green and lavender tubes and sent to lab.

## 2018-06-17 NOTE — ED Triage Notes (Addendum)
Pt arrives via EMS from [facility] for altered mental status- was seen here 2 days ago after having a fall and some confusion- facility states she is normally wlaking around and talking appropriately but is now acting strange and are concerned about  UTI- pt does not remember her fall- pt is alert and oriented to self and place- EMS bp was 165/100- EMS reports pt was having weakness with walking and was not answering questions appropriately- CBG 130 per EMS

## 2018-06-17 NOTE — ED Provider Notes (Signed)
-----------------------------------------   5:19 PM on 06/17/2018 -----------------------------------------   Labs significant for evidence of dehydration with hemoconcentration and urinary ketones.  Troponin is slightly elevated at 0.04.  Not currently indicative of ACS.  Blood pressure improved with a second dose of IV hydralazine 10 mg.  Discussed with patient's brother who identifies himself as POA and notes that the patient has had acute worsening of her confusion and ability to care for herself in independent living environment over the past 3 or 4 days.  With evidence of dehydration and her uncontrolled hypertension, discussed with the hospitalist for further management for correction of these issues and hopefully improvement of her mental and functional status.   Final diagnoses:  Altered mental status, unspecified altered mental status type  Hypertension, unspecified type  Dehydration      Sharman Cheek, MD 06/17/18 1721

## 2018-06-17 NOTE — ED Notes (Signed)
Pt has blisters bilaterally on her lower extremities

## 2018-06-17 NOTE — ED Notes (Signed)
ED TO INPATIENT HANDOFF REPORT  ED Nurse Name and Phone #: Emireth Cockerham [phone]  S Name/Age/Gender Sara Bailey [age] y.o. female Room/Bed: ED17A/ED17A  Code Status   Code Status: Not on file  Home/SNF/Other Skilled nursing facility Patient oriented to: self and place Is this baseline? No   Triage Complete: Triage complete  Chief Complaint AMS  Triage Note [facility] altered mental status- was seen here 2 days ago after having a fall and some confusion- facility states she is normally wlaking around and talking appropriately but is now acting strange and are concerned about  UTI- pt does not remember her fall- pt is alert and oriented to self and place- EMS bp was 165/100- EMS reports pt was having weakness with walking and was not answering questions appropriately- CBG 130 per EMS   Allergies No Known Allergies  Level of Care/Admitting Diagnosis ED Disposition    ED Disposition Condition Comment   Admit  [facility]ospital [facility] [[id number]]  Level of Care: Med-Surg [16]  Covid Evaluation: N/A  Diagnosis: Altered mental status [780.97.ICD-9-CM]  Admitting Physician: Willadean CarolMAYO, KATY DODD [[id number]]  Attending Physician: Willadean CarolMAYO, KATY DODD [[id number]]  Estimated length of stay: past midnight tomorrow  Certification:: I certify this patient will need inpatient services for at least 2 midnights  PT Class (Do Not Modify): Inpatient [101]  PT Acc Code (Do Not Modify): Private [1]       B Medical/Surgery History Past Medical History:  Diagnosis Date  . Coronary artery disease   . Hypertension   . Stroke [facility](HCC)    History reviewed. No pertinent surgical history.   A IV Location/Drains/Wounds Patient Lines/Drains/Airways Status   Active Line/Drains/Airways    Name:   Placement date:   Placement time:   Site:   Days:   Peripheral IV 06/17/18 Right Hand   06/17/18    1425    Hand   less than 1   Peripheral IV 06/17/18 Left Hand    06/17/18    1727    Hand   less than 1          Intake/Output Last 24 hours No intake or output data in the 24 hours ending 06/17/18 1837  Labs/Imaging Results for orders placed or performed during the hospital encounter of 06/17/18 (from the past 48 hour(s))  SARS Coronavirus 2 (CEPHEID - Performed in [facility] lab), Hosp Order     Status: None   Collection Time: 06/17/18 12:33 PM  Result Value Ref Range   SARS Coronavirus 2 NEGATIVE NEGATIVE    Comment: (NOTE) If result is NEGATIVE SARS-CoV-2 target nucleic acids are NOT DETECTED. The SARS-CoV-2 RNA is generally detectable in upper and lower  respiratory specimens during the acute phase of infection. The lowest  concentration of SARS-CoV-2 viral copies this assay can detect is 250  copies / mL. A negative result does not preclude SARS-CoV-2 infection  and should not be used as the sole basis for treatment or other  patient management decisions.  A negative result may occur with  improper specimen collection / handling, submission of specimen other  than nasopharyngeal swab, presence of viral mutation(s) within the  areas targeted by this assay, and inadequate number of viral copies  (<250 copies / mL). A negative result must be combined with clinical  observations, patient history, and epidemiological information. If result is POSITIVE SARS-CoV-2 target nucleic acids are DETECTED. The SARS-CoV-2 RNA is generally detectable in upper  and lower  respiratory specimens dur ing the acute phase of infection.  Positive  results are indicative of active infection with SARS-CoV-2.  Clinical  correlation with patient history and other diagnostic information is  necessary to determine patient infection status.  Positive results do  not rule out bacterial infection or co-infection with other viruses. If result is PRESUMPTIVE POSTIVE SARS-CoV-2 nucleic acids MAY BE PRESENT.   A presumptive positive result was obtained on the  submitted specimen  and confirmed on repeat testing.  While 2019 novel coronavirus  (SARS-CoV-2) nucleic acids may be present in the submitted sample  additional confirmatory testing may be necessary for epidemiological  and / or clinical management purposes  to differentiate between  SARS-CoV-2 and other Sarbecovirus currently known to infect humans.  If clinically indicated additional testing with an alternate test  methodology [phone]) is advised. The SARS-CoV-2 RNA is generally  detectable in upper and lower respiratory sp ecimens during the acute  phase of infection. The expected result is Negative. Fact Sheet for Patients:  BoilerBrush.com.cy Fact Sheet for Healthcare Providers: https://pope.com/ This test is not yet approved or cleared by the [place] FDA and has been authorized for detection and/or diagnosis of SARS-CoV-2 by FDA under an Emergency Use Authorization (EUA).  This EUA will remain in effect (meaning this test can be used) for the duration of the COVID-19 declaration under Section 564(b)(1) of the Act, 21 U.S.C. section 360bbb-3(b)(1), unless the authorization is terminated or revoked sooner. Performed at [facility], [address] [place], [place] [address]   Urinalysis, Complete w Microscopic     Status: Abnormal   Collection Time: 06/17/18  1:40 PM  Result Value Ref Range   Color, Urine YELLOW (A) YELLOW   APPearance CLEAR (A) CLEAR   Specific Gravity, Urine 1.019 1.005 - 1.030   pH 5.0 5.0 - 8.0   Glucose, UA NEGATIVE NEGATIVE mg/dL   Hgb urine dipstick SMALL (A) NEGATIVE   Bilirubin Urine NEGATIVE NEGATIVE   Ketones, ur 20 (A) NEGATIVE mg/dL   Protein, ur 30 (A) NEGATIVE mg/dL   Nitrite NEGATIVE NEGATIVE   Leukocytes,Ua NEGATIVE NEGATIVE   RBC / HPF 0-5 0 - 5 RBC/hpf   WBC, UA 0-5 0 - 5 WBC/hpf   Bacteria, UA RARE (A) NONE SEEN   Squamous Epithelial / LPF NONE SEEN 0 - 5   Mucus  PRESENT     Comment: Performed at [facility], [address] [place], [place] [address]  CBC with Differential/Platelet     Status: Abnormal   Collection Time: 06/17/18  3:45 PM  Result Value Ref Range   WBC 10.6 (H) 4.0 - 10.5 K/uL   RBC 5.08 3.87 - 5.11 MIL/uL   Hemoglobin 14.7 12.0 - 15.0 g/dL   HCT 30.8 65.7 - 84.6 %   MCV 86.6 80.0 - 100.0 fL   MCH 28.9 26.0 - 34.0 pg   MCHC 33.4 30.0 - 36.0 g/dL   RDW 96.2 (H) 95.2 - 84.1 %   Platelets 236 150 - 400 K/uL   nRBC 0.0 0.0 - 0.2 %   Neutrophils Relative % 79 %   Neutro Abs 8.2 (H) 1.7 - 7.7 K/uL   Lymphocytes Relative 14 %   Lymphs Abs 1.5 0.7 - 4.0 K/uL   Monocytes Relative 7 %   Monocytes Absolute 0.8 0.1 - 1.0 K/uL   Eosinophils Relative 0 %   Eosinophils Absolute 0.0 0.0 - 0.5 K/uL   Basophils Relative 0 %  Basophils Absolute 0.0 0.0 - 0.1 K/uL   Immature Granulocytes 0 %   Abs Immature Granulocytes 0.03 0.00 - 0.07 K/uL    Comment: Performed at [facility], [address] [place], [place] [address]  Comprehensive metabolic panel     Status: Abnormal   Collection Time: 06/17/18  3:45 PM  Result Value Ref Range   Sodium 146 (H) 135 - 145 mmol/L   Potassium 3.4 (L) 3.5 - 5.1 mmol/L   Chloride 106 98 - 111 mmol/L   CO2 26 22 - 32 mmol/L   Glucose, Bld 119 (H) 70 - 99 mg/dL   BUN 23 8 - 23 mg/dL   Creatinine, Ser 9.67 (H) 0.44 - 1.00 mg/dL   Calcium 89.3 (H) 8.9 - 10.3 mg/dL   Total Protein 8.2 (H) 6.5 - 8.1 g/dL   Albumin 4.4 3.5 - 5.0 g/dL   AST 26 15 - 41 U/L   ALT 18 0 - 44 U/L   Alkaline Phosphatase 112 38 - 126 U/L   Total Bilirubin 1.2 0.3 - 1.2 mg/dL   GFR calc non Af Amer 44 (L) >60 mL/min   GFR calc Af Amer 51 (L) >60 mL/min   Anion gap 14 5 - 15    Comment: Performed at [facility], [address] [place], [place] [address]  Troponin I - Once     Status: Abnormal   Collection Time: 06/17/18  3:45 PM  Result Value Ref Range   Troponin I 0.04 (HH) <0.03 ng/mL     Comment: CRITICAL RESULT CALLED TO, READ BACK BY AND VERIFIED WITH KIM MAIN RN AT 1605 06/17/2018.MSS Performed at [facility], [address] [place], [place] [address]    Ct Head Wo Contrast  Result Date: 06/17/2018 CLINICAL DATA:  Altered mental status EXAM: CT HEAD WITHOUT CONTRAST TECHNIQUE: Contiguous axial images were obtained from the base of the skull through the vertex without intravenous contrast. COMPARISON:  06/15/2018 FINDINGS: Brain: Similar findings of atrophy with sulcal prominence and centralized volume loss with commensurate ex vacuo dilatation of the ventricular system, left slightly greater than right. Nearly confluent periventricular hypodensities compatible with microvascular ischemic disease. Old lacunar infarct within the left basal ganglia. Given extensive background parenchymal abnormalities, there is no CT evidence superimposed acute large territory infarct. No intraparenchymal or extra-axial mass or hemorrhage. Unchanged size and configuration of the ventricles and the basilar cisterns. 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 No midline shift. Vascular: Intracranial atherosclerosis. Skull: No displaced calvarial fracture. Sinuses/Orbits: Scattered opacification of the left anterior ethmoidal air cells. The remaining paranasal sinuses and mastoid air cells are normally aerated. No air-fluid levels. Other: Regional soft tissues appear normal. IMPRESSION: 1. Similar findings of atrophy and microvascular ischemic disease without superimposed acute intracranial process. 2. Sinus disease above.  No air-fluid levels. Electronically Signed   By: Simonne Come M.D.   On: 06/17/2018 13:25   Dg Chest Portable 1 View  Result Date: 06/17/2018 CLINICAL DATA:  Altered mental status. EXAM: PORTABLE CHEST 1 VIEW COMPARISON:  03/20/2018 FINDINGS: Grossly unchanged enlarged cardiac silhouette and mediastinal contours. Atherosclerotic plaque when the thoracic aorta. The lungs  remain hyperexpanded with flattening the diaphragms mild diffuse slightly nodular thickening of the pulmonary interstitium. Bibasilar heterogeneous opacities are unchanged favored to represent atelectasis or scar. Unchanged symmetric biapical pleuroparenchymal thickening. No new focal airspace opacities. No pleural effusion or pneumothorax. No acute or aggressive osseous abnormalities. IMPRESSION: Similar findings of lung hyperexpansion and chronic  bronchitic change without superimposed acute cardiopulmonary disease. Electronically Signed   By: Simonne Come M.D.   On: 06/17/2018 13:26    Pending Labs Unresulted Labs (From admission, onward)    Start     Ordered   06/17/18 1132  CBC with Differential  ONCE - STAT,   STAT     06/17/18 1132   Signed and Held  Basic metabolic panel  Tomorrow morning,   R     Signed and Held   Signed and Held  CBC  Tomorrow morning,   R     Signed and Held   Signed and Held  Troponin I - Now Then Q6H  Now then every 6 hours,   R     Signed and Held   Signed and Held  HIV Antibody (routine testing w rflx)  Once,   R     Signed and Held   Signed and Held  RPR  Once,   R     Signed and Held   Signed and Held  TSH  Once,   R     Signed and Held   Signed and Held  Vitamin B12  Once,   R     Signed and Held   Signed and Held  Folate  Once,   R     Signed and Held   Signed and Held  Ammonia  Once,   R     Signed and Held          Vitals/Pain Today's Vitals   06/17/18 1715 06/17/18 1730 06/17/18 1745 06/17/18 1800  BP: (!) 161/91 (!) 144/85 (!) 161/74 (!) 151/76  Pulse: (!) 101 91 87 84  Resp: (!) 22 (!) Temp:      TempSrc:      SpO2: 98% 96% 97% 94%  Weight:      Height:      PainSc:        Isolation Precautions No active isolations  Medications Medications  hydrALAZINE (APRESOLINE) injection 5 mg (5 mg Intravenous Given 06/17/18 1455)  hydrALAZINE (APRESOLINE) injection 10 mg (10 mg Intravenous Given 06/17/18 1617)  sodium chloride 0.9  % bolus 1,000 mL (1,000 mLs Intravenous New Bag/Given 06/17/18 1730)    Mobility walks High fall risk   Focused Assessments Cardiac Assessment Handoff:    Lab Results  Component Value Date   TROPONINI 0.04 (HH) 06/17/2018   No results found for: DDIMER Does the Patient currently have chest pain? No     R Recommendations: See Admitting Provider Note  Report given to:   Additional Notes: Pt typically walks, but has had increased weakness  the past 2 days

## 2018-06-18 ENCOUNTER — Other Ambulatory Visit: Payer: Self-pay

## 2018-06-18 LAB — CBC
HCT: 36.2 % (ref 36.0–46.0)
Hemoglobin: 12.4 g/dL (ref 12.0–15.0)
MCH: 29 pg (ref 26.0–34.0)
MCHC: 34.3 g/dL (ref 30.0–36.0)
MCV: 84.6 fL (ref 80.0–100.0)
Platelets: 238 10*3/uL (ref 150–400)
RBC: 4.28 MIL/uL (ref 3.87–5.11)
RDW: 15.3 % (ref 11.5–15.5)
WBC: 10.5 10*3/uL (ref 4.0–10.5)
nRBC: 0 % (ref 0.0–0.2)

## 2018-06-18 LAB — TROPONIN I
Troponin I: 0.13 ng/mL (ref <0.03)
Troponin I: 0.13 ng/mL (ref <0.03)
Troponin I: 0.1 ng/mL (ref <0.03)
Troponin I: 0.11 ng/mL (ref <0.03)
Troponin I: 0.11 ng/mL (ref <0.03)

## 2018-06-18 LAB — BASIC METABOLIC PANEL
Anion gap: 9 (ref 5–15)
BUN: 19 mg/dL (ref 8–23)
CO2: 24 mmol/L (ref 22–32)
Calcium: 10 mg/dL (ref 8.9–10.3)
Chloride: 108 mmol/L (ref 98–111)
Creatinine, Ser: 0.87 mg/dL (ref 0.44–1.00)
GFR calc Af Amer: >60 mL/min (ref >60)
GFR calc non Af Amer: >60 mL/min (ref >60)
Glucose, Bld: 122 mg/dL — ABNORMAL HIGH (ref 70–99)
Potassium: 3.8 mmol/L (ref 3.5–5.1)
Sodium: 141 mmol/L (ref 135–145)

## 2018-06-18 LAB — VITAMIN B12: Vitamin B-12: 327 pg/mL (ref 180–914)

## 2018-06-18 NOTE — Progress Notes (Signed)
Dr. Sheryle Hail contacted via secure chat.  Waiting for response. Windy Carina, RN 4:15 AM 06/18/2018

## 2018-06-18 NOTE — Progress Notes (Signed)
Hospitalist paged for elevated Troponin 0.13; waiting for callback. Windy Carina, RN 3:37 AM 06/18/2018

## 2018-06-18 NOTE — Progress Notes (Signed)
Hospitalist repaged for elevated Troponin. Awaiting callback. Windy Carina, RN 3:49 AM; 06/18/2018

## 2018-06-18 NOTE — Progress Notes (Signed)
[facility] [place] [facility]   PATIENT NAME: Sara Bailey    MR#:  [id number]  DATE OF BIRTH:  Jan 19, 1941  SUBJECTIVE:  CHIEF COMPLAINT:   Chief Complaint  Patient presents with  . Altered Mental Status  Seen and evaluated today Not completely alert and oriented Confused No complaints of any chest pain Has elevated troponin  REVIEW OF SYSTEMS:    ROS Could not be obtained secondary to confusion  DRUG ALLERGIES:  No Known Allergies  VITALS:  Blood pressure 127/65, pulse 63, temperature 98.4 F (36.9 C), temperature source Oral, resp. rate 18, height 5\' 6"  (1.676 m), weight 56.7 kg, SpO2 100 %.  PHYSICAL EXAMINATION:   Physical Exam  GENERAL:  78 y.o.-year-old patient lying in the bed with no acute distress.  EYES: Pupils equal, round, reactive to light and accommodation. No scleral icterus. Extraocular muscles intact.  HEENT: Head atraumatic, normocephalic. Oropharynx and nasopharynx clear.  NECK:  Supple, no jugular venous distention. No thyroid enlargement, no tenderness.  LUNGS: Normal breath sounds bilaterally, no wheezing, rales, rhonchi. No use of accessory muscles of respiration.  CARDIOVASCULAR: S1, S2 normal. No murmurs, rubs, or gallops.  ABDOMEN: Soft, nontender, nondistended. Bowel sounds present. No organomegaly or mass.  EXTREMITIES: No cyanosis, clubbing or edema b/l.    NEUROLOGIC: Awake Moves all extremities Not completely oriented PSYCHIATRIC: The patient is alert and oriented x 1  SKIN: No obvious rash, lesion, or ulcer.   LABORATORY PANEL:   CBC Recent Labs  Lab 06/18/18 0230  WBC 10.5  HGB 12.4  HCT 36.2  PLT 238   ------------------------------------------------------------------------------------------------------------------ Chemistries  Recent Labs  Lab 06/17/18 1545 06/18/18 0230  NA 146* 141  K 3.4* 3.8  CL 106 108  CO2 26 24  GLUCOSE 119* 122*  BUN 23 19  CREATININE 1.17* 0.87  CALCIUM 10.9*  10.0  AST 26  --   ALT 18  --   ALKPHOS 112  --   BILITOT 1.2  --    ------------------------------------------------------------------------------------------------------------------  Cardiac Enzymes Recent Labs  Lab 06/18/18 0828  TROPONINI 0.13*   ------------------------------------------------------------------------------------------------------------------  RADIOLOGY:  Ct Head Wo Contrast  Result Date: 06/17/2018 CLINICAL DATA:  Altered mental status EXAM: CT HEAD WITHOUT CONTRAST TECHNIQUE: Contiguous axial images were obtained from the base of the skull through the vertex without intravenous contrast. COMPARISON:  06/15/2018 FINDINGS: Brain: Similar findings of atrophy with sulcal prominence and centralized volume loss with commensurate ex vacuo dilatation of the ventricular system, left slightly greater than right. Nearly confluent periventricular hypodensities compatible with microvascular ischemic disease. Old lacunar infarct within the left basal ganglia. Given extensive background parenchymal abnormalities, there is no CT evidence superimposed acute large territory infarct. No intraparenchymal or extra-axial mass or hemorrhage. Unchanged size and configuration of the ventricles and the basilar cisterns. 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 No midline shift. Vascular: Intracranial atherosclerosis. Skull: No displaced calvarial fracture. Sinuses/Orbits: Scattered opacification of the left anterior ethmoidal air cells. The remaining paranasal sinuses and mastoid air cells are normally aerated. No air-fluid levels. Other: Regional soft tissues appear normal. IMPRESSION: 1. Similar findings of atrophy and microvascular ischemic disease without superimposed acute intracranial process. 2. Sinus disease above.  No air-fluid levels. Electronically Signed   By: Simonne ComeJohn  Watts M.D.   On: 06/17/2018 13:25   Dg Chest Portable 1 View  Result Date: 06/17/2018 CLINICAL DATA:  Altered  mental status. EXAM: PORTABLE CHEST 1 VIEW COMPARISON:  03/20/2018 FINDINGS: Grossly unchanged enlarged  cardiac silhouette and mediastinal contours. Atherosclerotic plaque when the thoracic aorta. The lungs remain hyperexpanded with flattening the diaphragms mild diffuse slightly nodular thickening of the pulmonary interstitium. Bibasilar heterogeneous opacities are unchanged favored to represent atelectasis or scar. Unchanged symmetric biapical pleuroparenchymal thickening. No new focal airspace opacities. No pleural effusion or pneumothorax. No acute or aggressive osseous abnormalities. IMPRESSION: Similar findings of lung hyperexpansion and chronic bronchitic change without superimposed acute cardiopulmonary disease. Electronically Signed   By: Simonne Come M.D.   On: 06/17/2018 13:26     ASSESSMENT AND PLAN:   78 year old elderly female patient with history of hypertension, CVA, coronary artery disease currently under hospitalist service for confusion  -Acute encephalopathy Could be from worsening dementia CT head no acute abnormality Follow-up B12 folic acid levels and TSH and RPR  -Hypertension Better controlled New Norvasc 10 mg daily Continue HCTZ 25 mg daily IV hydralazine as needed  -Acute hypokalemia improved Potassium replaced  -CKD stage III Creatinine back to baseline  Back to baseline Avoid nephrotoxic meds  -History of CVA Continue aspirin and Lipitor  -Elevated troponin Could be from demand ischemia Cardiology  evaluation  All the records are reviewed and case discussed with Care Management/Social Worker. Management plans discussed with the patient, family and they are in agreement.  CODE STATUS: Full code  DVT Prophylaxis: SCDs  TOTAL TIME TAKING CARE OF THIS PATIENT: 38 minutes.   POSSIBLE D/C IN 2 to 3 DAYS, DEPENDING ON CLINICAL CONDITION.  Ihor Austin M.D on 06/18/2018 at 12:45 PM  Between 7am to 6pm - Pager - [phone]  After 6pm go to  [email or web address] - password [email or web address]  [facility]  Office  [phone]  CC: Primary care physician; [employer]  Note: This dictation was prepared with Dragon dictation along with smaller phrase technology. Any transcriptional errors that result from this process are unintentional.

## 2018-06-18 NOTE — Progress Notes (Signed)
PT Cancellation Note  Patient Details Name: Sara Bailey MRN: [id number] DOB: August 07, 1940   Cancelled Treatment:    Reason Eval/Treat Not Completed: Patient not medically ready.  PT consult received.  Chart reviewed.  Troponin noted to be up-trending to 0.13.  D/t up-trending troponin, will hold PT at this time and will monitor pt's status and initiate PT evaluation at a later date/time when medically appropriate.  Hendricks Limes, PT 06/18/18, 8:42 AM [phone]

## 2018-06-18 NOTE — Consult Note (Signed)
[facility] Consultation [facility]  Patient ID: Sara Bailey, MRN: [id number], DOB/AGE: [birth year] 78 y.o. Admit date: 06/17/2018   Date of Consult: 06/18/2018 Primary Physician: Almetta LovelyHousecalls, [facility]  Chief Complaint:  Chief Complaint  Patient presents with  . Altered Mental Status   Reason for Consult: Altered mental status with coronary disease hypertension and elevated troponin  HPI: 78 y.o. female with known coronary disease with no apparent previous myocardial infarction and significant hypertension hyperlipidemia and previous stroke on appropriate medication management.  The patient has had several days of altered mental status for which the patient is unable to do or converse really well.  She has had admission to the hospital with an EKG showing normal sinus rhythm with left atrial enlargement right bundle branch block with left ventricular hypertrophy and PVCs.  This is not significantly changed from before and has not changed throughout her hospitalization.  She does have an elevated troponin of 0.13 and a chest x-ray showing some hyperinflation but no evidence of congestive heart failure or pulmonary edema.  Patient currently has no evidence of chest pain or shortness of breath and still has had difficulty with altered mental status.  Currently she is hemodynamically stable  Past Medical History:  Diagnosis Date  . Coronary artery disease   . Hypertension   . Stroke [facility](HCC)       Surgical History: History reviewed. No pertinent surgical history.   Home Meds: Prior to Admission medications   Medication Sig Start Date End Date Taking? Authorizing Provider  amLODipine (NORVASC) 5 MG tablet Take 5 mg by mouth daily. 03/05/18  Yes [provider]  aspirin 81 MG chewable tablet Chew 81 mg by mouth daily. 07/14/17 07/14/18 Yes [provider]  atorvastatin (LIPITOR) 80 MG tablet Take 80 mg by mouth daily. 03/05/18  Yes  [provider]  hydrochlorothiazide (MICROZIDE) 12.5 MG capsule Take 12.5 mg by mouth daily. 02/05/18  Yes [provider]  latanoprost (XALATAN) 0.005 % ophthalmic solution Apply 1 drop to eye at bedtime. 01/12/18  Yes [provider]  levothyroxine (SYNTHROID, LEVOTHROID) 75 MCG tablet Take 75 mcg by mouth daily. 03/04/18  Yes [provider]  Melatonin 3 MG TABS Take 3 mg by mouth at bedtime.   Yes [provider]  mirtazapine (REMERON) 7.5 MG tablet Take 7.5 mg by mouth at bedtime.  06/14/18  Yes [provider]    Inpatient Medications:  . amLODipine  10 mg Oral Daily  . aspirin  81 mg Oral Daily  . atorvastatin  80 mg Oral Daily  . enoxaparin (LOVENOX) injection  40 mg Subcutaneous Q24H  . hydrochlorothiazide  25 mg Oral Daily  . latanoprost  1 drop Both Eyes QHS  . levothyroxine  75 mcg Oral Daily  . Melatonin  5 mg Oral QHS  . mirtazapine  7.5 mg Oral QHS     Allergies: No Known Allergies  Social History   Socioeconomic History  . Marital status: Single    Spouse name: Not on file  . Number of children: Not on file  . Years of education: Not on file  . Highest education level: Not on file  Occupational History  . Not on file  Social Needs  . Financial resource strain: Not on file  . Food insecurity:    Worry: Not on file    Inability: Not on file  . Transportation needs:    Medical: Not on file    Non-medical: Not  on file  Tobacco Use  . Smoking status: Former Games developer  . Smokeless tobacco: Never Used  Substance and Sexual Activity  . Alcohol use: Never    Frequency: Never  . Drug use: Never  . Sexual activity: Not on file  Lifestyle  . Physical activity:    Days per week: Not on file    Minutes per session: Not on file  . Stress: Not on file  Relationships  . Social connections:    Talks on phone: Not on file    Gets together: Not on file    Attends religious service: Not on file    Active member of  club or organization: Not on file    Attends meetings of clubs or organizations: Not on file    Relationship status: Not on file  . Intimate partner violence:    Fear of current or ex partner: Not on file    Emotionally abused: Not on file    Physically abused: Not on file    Forced sexual activity: Not on file  Other Topics Concern  . Not on file  Social History Narrative  . Not on file     No family history on file.   Review of Systems Cannot assess due to altered mental status Labs: Recent Labs    06/17/18 2044 06/18/18 0230 06/18/18 0828 06/18/18 1229  TROPONINI 0.07* 0.13* 0.13* 0.10*   Lab Results  Component Value Date   WBC 10.5 06/18/2018   HGB 12.4 06/18/2018   HCT 36.2 06/18/2018   MCV 84.6 06/18/2018   PLT 238 06/18/2018    Recent Labs  Lab 06/17/18 1545 06/18/18 0230  NA 146* 141  K 3.4* 3.8  CL 106 108  CO2 26 24  BUN 23 19  CREATININE 1.17* 0.87  CALCIUM 10.9* 10.0  PROT 8.2*  --   BILITOT 1.2  --   ALKPHOS 112  --   ALT 18  --   AST 26  --   GLUCOSE 119* 122*   No results found for: CHOL, HDL, LDLCALC, TRIG No results found for: DDIMER  Radiology/Studies:  Ct Head Wo Contrast  Result Date: 06/17/2018 CLINICAL DATA:  Altered mental status EXAM: CT HEAD WITHOUT CONTRAST TECHNIQUE: Contiguous axial images were obtained from the base of the skull through the vertex without intravenous contrast. COMPARISON:  06/15/2018 FINDINGS: Brain: Similar findings of atrophy with sulcal prominence and centralized volume loss with commensurate ex vacuo dilatation of the ventricular system, left slightly greater than right. Nearly confluent periventricular hypodensities compatible with microvascular ischemic disease. Old lacunar infarct within the left basal ganglia. Given extensive background parenchymal abnormalities, there is no CT evidence superimposed acute large territory infarct. No intraparenchymal or extra-axial mass or hemorrhage. Unchanged size and  configuration of the ventricles and the basilar cisterns. 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 No midline shift. Vascular: Intracranial atherosclerosis. Skull: No displaced calvarial fracture. Sinuses/Orbits: Scattered opacification of the left anterior ethmoidal air cells. The remaining paranasal sinuses and mastoid air cells are normally aerated. No air-fluid levels. Other: Regional soft tissues appear normal. IMPRESSION: 1. Similar findings of atrophy and microvascular ischemic disease without superimposed acute intracranial process. 2. Sinus disease above.  No air-fluid levels. Electronically Signed   By: Simonne Come M.D.   On: 06/17/2018 13:25   Ct Head Wo Contrast  Result Date: 06/15/2018 CLINICAL DATA:  Altered mental status EXAM: CT HEAD WITHOUT CONTRAST TECHNIQUE: Contiguous axial images were obtained from the base of  the skull through the vertex without intravenous contrast. COMPARISON:  None. FINDINGS: Brain: No evidence of acute infarction, hemorrhage, hydrocephalus, extra-axial collection or mass lesion/mass effect. Periventricular and deep white matter hypodensity. Vascular: No hyperdense vessel or unexpected calcification. Skull: Normal. Negative for fracture or focal lesion. Sinuses/Orbits: No acute finding. Other: None. IMPRESSION: No acute intracranial pathology.  Small-vessel white matter disease. Electronically Signed   By: Lauralyn Primes M.D.   On: 06/15/2018 13:51   Dg Chest Portable 1 View  Result Date: 06/17/2018 CLINICAL DATA:  Altered mental status. EXAM: PORTABLE CHEST 1 VIEW COMPARISON:  03/20/2018 FINDINGS: Grossly unchanged enlarged cardiac silhouette and mediastinal contours. Atherosclerotic plaque when the thoracic aorta. The lungs remain hyperexpanded with flattening the diaphragms mild diffuse slightly nodular thickening of the pulmonary interstitium. Bibasilar heterogeneous opacities are unchanged favored to represent atelectasis or scar. Unchanged symmetric  biapical pleuroparenchymal thickening. No new focal airspace opacities. No pleural effusion or pneumothorax. No acute or aggressive osseous abnormalities. IMPRESSION: Similar findings of lung hyperexpansion and chronic bronchitic change without superimposed acute cardiopulmonary disease. Electronically Signed   By: Simonne Come M.D.   On: 06/17/2018 13:26    EKG: Normal sinus rhythm with left atrial enlargement right bundle branch block with left ventricular hypertrophy and frequent preventricular contractions  Weights: Filed Weights   06/17/18 1127  Weight: 56.7 kg     Physical Exam: Blood pressure 127/65, pulse 63, temperature 98.4 F (36.9 C), temperature source Oral, resp. rate 18, height 5\' 6"  (1.676 m), weight 56.7 kg, SpO2 100 %. Body mass index is 20.18 kg/m. General: Well developed, well nourished, in no acute distress. Head eyes ears nose throat: Normocephalic, atraumatic, sclera non-icteric, no xanthomas, nares are without discharge. No apparent thyromegaly and/or mass  Lungs: Normal respiratory effort.  no wheezes, no rales, no rhonchi.  Heart: RRR with normal S1 S2. no murmur gallop, no rub, PMI is normal size and placement, carotid upstroke normal without bruit, jugular venous pressure is normal Abdomen: Soft, non-tender, non-distended with normoactive bowel sounds. No hepatomegaly. No rebound/guarding. No obvious abdominal masses. Abdominal aorta is normal size without bruit Extremities: Trace edema. no cyanosis, no clubbing, no ulcers  Peripheral : 2+ bilateral upper extremity pulses, 2+ bilateral femoral pulses, 2+ bilateral dorsal pedal pulse Neuro: Is not alert and oriented. No facial asymmetry. No focal deficit. Moves all extremities spontaneously. Musculoskeletal: Normal muscle tone without kyphosis Psych: Does not responds to questions appropriately with a normal affect.    Assessment: 78 year old female with known coronary artery disease essential hypertension mixed  hyperlipidemia peripheral vascular disease with previous stroke having altered mental status of unknown etiology and elevated troponin consistent with demand ischemia rather than acute coronary syndrome and no evidence of congestive heart failure  Plan: 1.  Continue supportive care and further intervention and assessment for altered mental status 2.  No further cardiac intervention at this time due to no evidence of symptoms of heart failure and/or angina or myocardial infarction with troponin consistent with demand ischemia rather than acute coronary syndrome 3.  Continue high intensity cholesterol therapy and hypertension control for coronary atherosclerosis and peripheral vascular disease without change 4.  Ambulation and follow for further symptoms  Signed, Lamar Blinks M.D. [facility] Cardiology 06/18/2018, 1:13 PM

## 2018-06-18 NOTE — Progress Notes (Signed)
Dr. Sheryle Hail notified of elevated troponin via secure chat. Daily ASA given now; acknowledged; Telemetry applied and EKG ordered; will continue to monitor; SR on telemetry. Windy Carina, RN 4:44 AM 06/18/2018

## 2018-06-18 NOTE — TOC Initial Note (Addendum)
Transition of Care [facility]) - Initial/Assessment Note    Patient Details  Name: Sara Bailey MRN: [id number] Date of Birth: 18-Feb-1940  Transition of Care [facility]) CM/SW Contact:    Elliot Gault, LCSW Phone Number: 06/18/2018, 4:17 PM  Clinical Narrative:                  Pt resides at [facility] independent living. Unable to complete full assessment with pt due to some disorientation. Spoke with pt's brother by phone. Brother states that pt was getting some additional services in her home from [facility]. These services include laundry, medication management, etc. Brother also states that pt was receiving PT, OT and SLP at home from [facility]. Brother states that these agencies are affiliated with [facility].  Pt's brother states pt was ambulating with a walker at home prior to admission. Her meals were being delivered to her apartment due to the restrictions are dining hall meals because of COVID. Pt's brother feels pt's condition has deteriorated since around the time the virus pandemic started. He states that there has been a change in her routine due to the virus and he and his wife haven't been able to visit her like they used to.   Pt has Therapist, occupational for primary care and her brother states that she has not had any difficulty obtaining medications as needed. Her brother helps manage pt's affairs.  Pt's brother anticipating that pt will need SNF rehab at dc. Currently awaiting PT eval to determine their recommendations.   TOC will follow and assist with referrals as needed. Expected Discharge Plan: Skilled Nursing Facility Barriers to Discharge: Continued Medical Work up   Patient Goals and CMS Choice        Expected Discharge Plan and Services Expected Discharge Plan: Skilled Nursing Facility       Living arrangements for the past 2 months: Apartment Expected Discharge Date: 06/20/18                                    Prior Living  Arrangements/Services Living arrangements for the past 2 months: Apartment Lives with:: Self Patient language and need for interpreter reviewed:: Yes Do you feel safe going back to the place where you live?: Yes      Need for Family Participation in Patient Care: Yes (Comment) Care giver support system in place?: Yes (comment) Current home services: Home PT, Home OT, DME Criminal Activity/Legal Involvement Pertinent to Current Situation/Hospitalization: No - Comment as needed  Activities of Daily Living   ADL Screening (condition at time of admission) Patient's cognitive ability adequate to safely complete daily activities?: No Is the patient deaf or have difficulty hearing?: No Does the patient have difficulty concentrating, remembering, or making decisions?: Yes Patient able to express need for assistance with ADLs?: No Independently performs ADLs?: No Communication: Independent Toileting: Needs assistance Is this a change from baseline?: Change from baseline, expected to last <3 days  Permission Sought/Granted                  Emotional Assessment Appearance:: Appears stated age Attitude/Demeanor/Rapport: Unable to Assess Affect (typically observed): Unable to Assess Orientation: : Oriented to Self Alcohol / Substance Use: Not Applicable Psych Involvement: No (comment)  Admission diagnosis:  Dehydration [E86.0] Altered mental status, unspecified altered mental status type [R41.82] Hypertension, unspecified type [I10] Patient Active Problem List   Diagnosis Date Noted  .  Altered mental status 06/17/2018   PCP:  [facility] Pharmacy:  No Pharmacies Listed    Social Determinants of Health (SDOH) Interventions    Readmission Risk Interventions No flowsheet data found.

## 2018-06-18 NOTE — Progress Notes (Signed)
Supervisor and ED Charge Nurse notified of inability to contact Hospitalist on-call; waiting for callback. Windy Carina, RN 4:11 AM 06/18/2018

## 2018-06-19 LAB — RPR: RPR Ser Ql: NONREACTIVE

## 2018-06-19 LAB — HIV ANTIBODY (ROUTINE TESTING W REFLEX): HIV Screen 4th Generation wRfx: NONREACTIVE

## 2018-06-19 NOTE — Progress Notes (Signed)
[facility] [place] [facility]   PATIENT NAME: Sara Bailey    MR#:  [id number]  DATE OF BIRTH:  [birth year]  SUBJECTIVE:  CHIEF COMPLAINT:   Chief Complaint  Patient presents with  . Altered Mental Status  Seen and evaluated today Awake and alert and responds to verbal commands No complaints of any chest pain Has elevated troponins  REVIEW OF SYSTEMS:    ROS Review of Systems  Constitutional: Generalized weakness HENT: Negative.   Eyes: Negative.   Respiratory: Negative.   Cardiovascular: Negative.   Gastrointestinal: Negative.   Genitourinary: Negative.   Musculoskeletal: Negative.   Skin: Negative.   Neurological: Negative.   Endo/Heme/Allergies: Negative.   Psychiatric/Behavioral: Negative.   All other systems reviewed and are negative.  DRUG ALLERGIES:  No Known Allergies  VITALS:  Blood pressure (!) 148/68, pulse 60, temperature 98 F (36.7 C), temperature source Oral, resp. rate 18, height 5\' 6"  (1.676 m), weight 56.7 kg, SpO2 100 %.  PHYSICAL EXAMINATION:   Physical Exam  GENERAL:  78 y.o.-year-old patient lying in the bed with no acute distress.  EYES: Pupils equal, round, reactive to light and accommodation. No scleral icterus. Extraocular muscles intact.  HEENT: Head atraumatic, normocephalic. Oropharynx and nasopharynx clear.  NECK:  Supple, no jugular venous distention. No thyroid enlargement, no tenderness.  LUNGS: Normal breath sounds bilaterally, no wheezing, rales, rhonchi. No use of accessory muscles of respiration.  CARDIOVASCULAR: S1, S2 normal. No murmurs, rubs, or gallops.  ABDOMEN: Soft, nontender, nondistended. Bowel sounds present. No organomegaly or mass.  EXTREMITIES: No cyanosis, clubbing or edema b/l.    NEUROLOGIC: Awake Moves all extremities PSYCHIATRIC: The patient is alert and oriented x 2  SKIN: No obvious rash, lesion, or ulcer.   LABORATORY PANEL:   CBC Recent Labs  Lab 06/18/18 0230  WBC 10.5   HGB 12.4  HCT 36.2  PLT 238   ------------------------------------------------------------------------------------------------------------------ Chemistries  Recent Labs  Lab 06/17/18 1545 06/18/18 0230  NA 146* 141  K 3.4* 3.8  CL 106 108  CO2 26 24  GLUCOSE 119* 122*  BUN 23 19  CREATININE 1.17* 0.87  CALCIUM 10.9* 10.0  AST 26  --   ALT 18  --   ALKPHOS 112  --   BILITOT 1.2  --    ------------------------------------------------------------------------------------------------------------------  Cardiac Enzymes Recent Labs  Lab 06/18/18 2314  TROPONINI 0.11*   ------------------------------------------------------------------------------------------------------------------  RADIOLOGY:  Ct Head Wo Contrast  Result Date: 06/17/2018 CLINICAL DATA:  Altered mental status EXAM: CT HEAD WITHOUT CONTRAST TECHNIQUE: Contiguous axial images were obtained from the base of the skull through the vertex without intravenous contrast. COMPARISON:  06/15/2018 FINDINGS: Brain: Similar findings of atrophy with sulcal prominence and centralized volume loss with commensurate ex vacuo dilatation of the ventricular system, left slightly greater than right. Nearly confluent periventricular hypodensities compatible with microvascular ischemic disease. Old lacunar infarct within the left basal ganglia. Given extensive background parenchymal abnormalities, there is no CT evidence superimposed acute large territory infarct. No intraparenchymal or extra-axial mass or hemorrhage. Unchanged size and configuration of the ventricles and the basilar cisterns. 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 No midline shift. Vascular: Intracranial atherosclerosis. Skull: No displaced calvarial fracture. Sinuses/Orbits: Scattered opacification of the left anterior ethmoidal air cells. The remaining paranasal sinuses and mastoid air cells are normally aerated. No air-fluid levels. Other: Regional soft tissues  appear normal. IMPRESSION: 1. Similar findings of atrophy and microvascular ischemic disease without superimposed acute intracranial process.  2. Sinus disease above.  No air-fluid levels. Electronically Signed   By: Simonne Come M.D.   On: 06/17/2018 13:25   Dg Chest Portable 1 View  Result Date: 06/17/2018 CLINICAL DATA:  Altered mental status. EXAM: PORTABLE CHEST 1 VIEW COMPARISON:  03/20/2018 FINDINGS: Grossly unchanged enlarged cardiac silhouette and mediastinal contours. Atherosclerotic plaque when the thoracic aorta. The lungs remain hyperexpanded with flattening the diaphragms mild diffuse slightly nodular thickening of the pulmonary interstitium. Bibasilar heterogeneous opacities are unchanged favored to represent atelectasis or scar. Unchanged symmetric biapical pleuroparenchymal thickening. No new focal airspace opacities. No pleural effusion or pneumothorax. No acute or aggressive osseous abnormalities. IMPRESSION: Similar findings of lung hyperexpansion and chronic bronchitic change without superimposed acute cardiopulmonary disease. Electronically Signed   By: Simonne Come M.D.   On: 06/17/2018 13:26     ASSESSMENT AND PLAN:   78 year old elderly female patient with history of hypertension, CVA, coronary artery disease currently under hospitalist service for confusion  -Acute encephalopathy improving Could be from worsening dementia CT head no acute abnormality  -Hypertension Better controlled New Norvasc 10 mg daily Continue HCTZ 25 mg daily IV hydralazine as needed  -Acute hypokalemia improved Potassium replaced  -CKD stage III Creatinine back to baseline  Back to baseline Avoid nephrotoxic meds  -History of CVA Continue aspirin and Lipitor  -Elevated troponin Could be from demand ischemia Cardiology  Evaluation appreciated No acute intervention recommended  -Social worker follow-up for placement  All the records are reviewed and case discussed with Care  Management/Social Worker. Management plans discussed with the patient, family and they are in agreement.  CODE STATUS: Full code  DVT Prophylaxis: SCDs  TOTAL TIME TAKING CARE OF THIS PATIENT: 35 minutes.   POSSIBLE D/C IN 2 to 3 DAYS, DEPENDING ON CLINICAL CONDITION.  Ihor Austin M.D on 06/19/2018 at 12:21 PM  Between 7am to 6pm - Pager - [phone]  After 6pm go to [email or web address] - password [email or web address]  [facility] [place]  Office  [phone]  CC: Primary care physician; [facility]  Note: This dictation was prepared with Dragon dictation along with smaller phrase technology. Any transcriptional errors that result from this process are unintentional.

## 2018-06-19 NOTE — Progress Notes (Signed)
[facility]  Patient: Sara Bailey / Admit Date: 06/17/2018 / Date of Encounter: 06/19/2018, 1:01 PM   Subjective: Patient with slight improvements of encephalopathy since yesterday.  No evidence of congestive heart failure or anginal symptoms at this time.  Previous EKG has shown normal sinus rhythm right bundle branch block with left ventricular hypertrophy and PVCs unchanged.  Patient's blood pressure improved with amlodipine hydrochlorothiazide combination.  New strokelike symptoms or other concerns  Review of Systems: Unable to assess very well  Objective: Telemetry: Normal sinus rhythm Physical Exam: Blood pressure (!) 141/73, pulse 77, temperature 98 F (36.7 C), temperature source Oral, resp. rate 18, height 5\' 6"  (1.676 m), weight 56.7 kg, SpO2 96 %. Body mass index is 20.18 kg/m. General: Well developed, well nourished, in no acute distress. Head: Normocephalic, atraumatic, sclera non-icteric, no xanthomas, nares are without discharge. Neck: No apparent masses Lungs: Normal respirations with no wheezes, no rhonchi, no rales , no crackles   Heart: Regular rate and rhythm, normal S1 S2, no murmur, no rub, no gallop, PMI is normal size and placement, carotid upstroke normal without bruit, jugular venous pressure normal Abdomen: Soft, non-tender, non-distended with normoactive bowel sounds. No hepatosplenomegaly. Abdominal aorta is normal size without bruit Extremities: No edema, no clubbing, no cyanosis, no ulcers,  Peripheral: 2+ radial, 2+ femoral, 2+ dorsal pedal pulses Neuro: Not particularly alert and oriented. Moves all extremities spontaneously. Psych: Does not responds to questions appropriately with a normal affect.   Intake/Output Summary (Last 24 hours) at 06/19/2018 1301 Last data filed at 06/19/2018 0900 Gross per 24 hour  Intake 600 ml  Output 1050 ml  Net -450 ml    Inpatient Medications:  . amLODipine  10 mg Oral Daily   . aspirin  81 mg Oral Daily  . atorvastatin  80 mg Oral Daily  . enoxaparin (LOVENOX) injection  40 mg Subcutaneous Q24H  . hydrochlorothiazide  25 mg Oral Daily  . latanoprost  1 drop Both Eyes QHS  . levothyroxine  75 mcg Oral Daily  . Melatonin  5 mg Oral QHS  . mirtazapine  7.5 mg Oral QHS   Infusions:   Labs: Recent Labs    06/17/18 1545 06/18/18 0230  NA 146* 141  K 3.4* 3.8  CL 106 108  CO2 26 24  GLUCOSE 119* 122*  BUN 23 19  CREATININE 1.17* 0.87  CALCIUM 10.9* 10.0   Recent Labs    06/17/18 1545  AST 26  ALT 18  ALKPHOS 112  BILITOT 1.2  PROT 8.2*  ALBUMIN 4.4   Recent Labs    06/17/18 1545 06/18/18 0230  WBC 10.6* 10.5  NEUTROABS 8.2*  --   HGB 14.7 12.4  HCT 44.0 36.2  MCV 86.6 84.6  PLT 236 238   Recent Labs    06/18/18 0828 06/18/18 1229 06/18/18 1645 06/18/18 2314  TROPONINI 0.13* 0.10* 0.11* 0.11*   Invalid input(s): POCBNP No results for input(s): HGBA1C in the last 72 hours.   Weights: Filed Weights   06/17/18 1127  Weight: 56.7 kg     Radiology/Studies:  Ct Head Wo Contrast  Result Date: 06/17/2018 CLINICAL DATA:  Altered mental status EXAM: CT HEAD WITHOUT CONTRAST TECHNIQUE: Contiguous axial images were obtained from the base of the skull through the vertex without intravenous contrast. COMPARISON:  06/15/2018 FINDINGS: Brain: Similar findings of atrophy with sulcal prominence and centralized volume loss with commensurate ex vacuo dilatation of the ventricular system, left slightly greater  than right. Nearly confluent periventricular hypodensities compatible with microvascular ischemic disease. Old lacunar infarct within the left basal ganglia. Given extensive background parenchymal abnormalities, there is no CT evidence superimposed acute large territory infarct. No intraparenchymal or extra-axial mass or hemorrhage. Unchanged size and configuration of the ventricles and the basilar cisterns. 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 No midline shift. Vascular: Intracranial atherosclerosis. Skull: No displaced calvarial fracture. Sinuses/Orbits: Scattered opacification of the left anterior ethmoidal air cells. The remaining paranasal sinuses and mastoid air cells are normally aerated. No air-fluid levels. Other: Regional soft tissues appear normal. IMPRESSION: 1. Similar findings of atrophy and microvascular ischemic disease without superimposed acute intracranial process. 2. Sinus disease above.  No air-fluid levels. Electronically Signed   By: Simonne Come M.D.   On: 06/17/2018 13:25   Ct Head Wo Contrast  Result Date: 06/15/2018 CLINICAL DATA:  Altered mental status EXAM: CT HEAD WITHOUT CONTRAST TECHNIQUE: Contiguous axial images were obtained from the base of the skull through the vertex without intravenous contrast. COMPARISON:  None. FINDINGS: Brain: No evidence of acute infarction, hemorrhage, hydrocephalus, extra-axial collection or mass lesion/mass effect. Periventricular and deep white matter hypodensity. Vascular: No hyperdense vessel or unexpected calcification. Skull: Normal. Negative for fracture or focal lesion. Sinuses/Orbits: No acute finding. Other: None. IMPRESSION: No acute intracranial pathology.  Small-vessel white matter disease. Electronically Signed   By: Lauralyn Primes M.D.   On: 06/15/2018 13:51   Dg Chest Portable 1 View  Result Date: 06/17/2018 CLINICAL DATA:  Altered mental status. EXAM: PORTABLE CHEST 1 VIEW COMPARISON:  03/20/2018 FINDINGS: Grossly unchanged enlarged cardiac silhouette and mediastinal contours. Atherosclerotic plaque when the thoracic aorta. The lungs remain hyperexpanded with flattening the diaphragms mild diffuse slightly nodular thickening of the pulmonary interstitium. Bibasilar heterogeneous opacities are unchanged favored to represent atelectasis or scar. Unchanged symmetric biapical pleuroparenchymal thickening. No new focal airspace opacities. No pleural effusion  or pneumothorax. No acute or aggressive osseous abnormalities. IMPRESSION: Similar findings of lung hyperexpansion and chronic bronchitic change without superimposed acute cardiopulmonary disease. Electronically Signed   By: Simonne Come M.D.   On: 06/17/2018 13:26     Assessment and Recommendation  78 y.o. female with known essential hypertension mixed hyperlipidemia coronary atherosclerosis with previous stroke having significant mental status changes slightly improved at this time without evidence of myocardial infarction or congestive heart failure or new EKG changes 1.  No further cardiac intervention or diagnostics necessary at this time due to no evidence of congestive heart failure or myocardial infarction with a troponin level elevation most consistent with demand ischemia 2.  Continue supportive care of mental status changes and further diagnostics as necessary 3.  Continue high intensity cholesterol therapy for previous stroke and cardiovascular disease 4.  Begin ambulating as well following for any further significant side effects or symptoms 5.  Call if further questions  Signed, Arnoldo Hooker M.D. FACC

## 2018-06-19 NOTE — Evaluation (Signed)
Physical Therapy Evaluation Patient Details Name: Nayali Cisar MRN: [id number] DOB: Jan 09, 1941 Today's Date: 06/19/2018   History of Present Illness  Pt is an 78 y.o. female presenting to hospital 06/17/18 with AMS x3-4 days (pt with ED visit 5/15 for fall and AMS).  Pt admitted with uncontrolled htn, elevated troponin, hypokalemia, and acute encephalopathy.  PMH includes CAD, htn, stroke, CKD stage III.  Clinical Impression  Prior to hospital admission, pt was ambulatory with walker and lives at independent living facility (per chart review).  Pt noted with confusion during session.  Currently pt is SBA semi-supine to sit (increased time/effort for pt to perform on own).  Pt stood up to walker with mod to max assist but after standing pt requesting to toilet.  Pt assisted back to sitting on edge of bed and BSC brought to pt.  Upon assisting pt to stand again, pt suddenly incontinent of significant amount of urine on floor so pt assisted back to sitting on edge of bed for safety.  NT notified and came to assist therapist with clean-up.  Pt then stood and transferred to [facility] with 1 assist.  After finishing toileting, pt ambulated 5 feet with RW to recliner (pt demonstrating significant decreased cadence taking steps).  Posterior lean noted with each stand requiring cueing and assist to shift weight forward.  Generalized weakness noted.  Pt would benefit from skilled PT to address noted impairments and functional limitations (see below for any additional details).  Upon hospital discharge, recommend pt discharge to [facility].    Follow Up Recommendations SNF    Equipment Recommendations  Rolling walker with 5" wheels;3in1 (PT)    Recommendations for Other Services       Precautions / Restrictions Precautions Precautions: Fall Restrictions Weight Bearing Restrictions: No      Mobility  Bed Mobility Overal bed mobility: Needs Assistance Bed Mobility: Supine to Sit     Supine to sit:  Supervision;HOB elevated     General bed mobility comments: increased effort and time for pt to perform on own  Transfers Overall transfer level: Needs assistance Equipment used: Rolling walker (2 wheeled) Transfers: Sit to/from [employer] Sit to Stand: Mod assist;Max assist Stand pivot transfers: Min assist       General transfer comment: mod to max assist to stand from bed (x3 trials) and from [facility] (x1 trial) with vc's for UE/LE placement and assist to initiate stand up to RW; min assist to stand step turn bed to [facility] (very slow cadence and short steps noted)  Ambulation/Gait Ambulation/Gait assistance: Min assist Gait Distance (Feet): 5 Feet(BSC to recliner) Assistive device: Rolling walker (2 wheeled)   Gait velocity: very decreased   General Gait Details: decreased B LE step length/foot clearance/heelstrike; assist for walker management  Stairs            Wheelchair Mobility    Modified Rankin (Stroke Patients Only)       Balance Overall balance assessment: Needs assistance Sitting-balance support: No upper extremity supported;Feet supported Sitting balance-Leahy Scale: Good Sitting balance - Comments: steady sitting reaching within BOS     Standing balance-Leahy Scale: Poor Standing balance comment: pt requiring at least single UE support for static standing balance                             Pertinent Vitals/Pain Pain Assessment: Faces Faces Pain Scale: No hurt Pain Intervention(s): Limited activity within patient's tolerance;Monitored during session;Repositioned  Vitals (  HR and O2 on room air) stable and WFL throughout treatment session.    Home Living Family/patient expects to be discharged to:: Assisted living Living Arrangements: Alone               Additional Comments: [facility] per chart review    Prior Function Level of Independence: Needs assistance   Gait / Transfers Assistance  Needed: Per chart review pt ambulating with walker.  ADL's / Homemaking Assistance Needed: Per chart review pt recently required assist with laundry and medication management; d/t COVID restrictions having meals delivered to apt.  Comments: Receiving PT/OT/SLP at home per chart review.     Hand Dominance        Extremity/Trunk Assessment   Upper Extremity Assessment Upper Extremity Assessment: Generalized weakness;Difficult to assess due to impaired cognition    Lower Extremity Assessment Lower Extremity Assessment: Generalized weakness;Difficult to assess due to impaired cognition    Cervical / Trunk Assessment Cervical / Trunk Assessment: Normal  Communication   Communication: (Increased time to respond)  Cognition Arousal/Alertness: Awake/alert Behavior During Therapy: Flat affect Overall Cognitive Status: No family/caregiver present to determine baseline cognitive functioning                                 General Comments: Oriented to month and year of DOB (not day); reported currently May 15 2018      General Comments   Nursing cleared pt for participation in physical therapy.  Pt agreeable to PT session.    Exercises  Transfer training   Assessment/Plan    PT Assessment Patient needs continued PT services  PT Problem List Decreased strength;Decreased activity tolerance;Decreased balance;Decreased mobility;Decreased cognition;Decreased knowledge of use of DME;Decreased knowledge of precautions;Decreased safety awareness       PT Treatment Interventions DME instruction;Gait training;Functional mobility training;Therapeutic activities;Therapeutic exercise;Balance training    PT Goals (Current goals can be found in the Care Plan section)  Acute Rehab PT Goals Patient Stated Goal: to be able to walk more PT Goal Formulation: With patient Time For Goal Achievement: 07/03/18 Potential to Achieve Goals: Good    Frequency Min 2X/week   Barriers  to discharge Decreased caregiver support      Co-evaluation               AM-PAC PT "6 Clicks" Mobility  Outcome Measure Help needed turning from your back to your side while in a flat bed without using bedrails?: A Little Help needed moving from lying on your back to sitting on the side of a flat bed without using bedrails?: A Little Help needed moving to and from a bed to a chair (including a wheelchair)?: A Lot Help needed standing up from a chair using your arms (e.g., wheelchair or bedside chair)?: A Lot Help needed to walk in hospital room?: A Lot Help needed climbing 3-5 steps with a railing? : A Lot 6 Click Score: 14    End of Session Equipment Utilized During Treatment: Gait belt Activity Tolerance: Patient tolerated treatment well Patient left: in chair;with call bell/phone within reach;with chair alarm set Nurse Communication: Mobility status;Precautions PT Visit Diagnosis: Other abnormalities of gait and mobility (R26.89);Muscle weakness (generalized) (M62.81);History of falling (Z91.81);Difficulty in walking, not elsewhere classified (R26.2)    Time: 1115-1202 PT Time Calculation (min) (ACUTE ONLY): 47 min   Charges:   PT Evaluation $PT Eval Low Complexity: 1 Low PT Treatments $Therapeutic Activity: 23-37  mins       Hendricks Limesmily Ariq Khamis, PT 06/19/18, 1:39 PM [phone]

## 2018-06-19 NOTE — [place] FL2 (Signed)
[place] MEDICAID FL2 LEVEL OF CARE SCREENING TOOL     IDENTIFICATION  Patient Name: Sara Bailey Birthdate: 1940-11-12 Sex: female Admission Date (Current Location): 06/17/2018  [facility] and [place] Number:  Chiropodist and Address:  [facility], [address], [place], [place] [address]      Provider Number: [id number]  Attending Physician Name and Address:  Ihor Austin, MD  Relative Name and Phone Number:       Current Level of Care: Hospital Recommended Level of Care: Skilled Nursing Facility Prior Approval Number:    Date Approved/Denied:   PASRR Number:    Discharge Plan: SNF    Current Diagnoses: Patient Active Problem List   Diagnosis Date Noted  . Altered mental status 06/17/2018    Orientation RESPIRATION BLADDER Height & Weight     Self, Place  Normal Incontinent Weight: 125 lb (56.7 kg) Height:  5\' 6"  (167.6 cm)  BEHAVIORAL SYMPTOMS/MOOD NEUROLOGICAL BOWEL NUTRITION STATUS  (none) (none) Continent Diet(heart healthy)  AMBULATORY STATUS COMMUNICATION OF NEEDS Skin   Extensive Assist Verbally Normal                       Personal Care Assistance Level of Assistance  Bathing, Dressing Bathing Assistance: Maximum assistance   Dressing Assistance: Maximum assistance     Functional Limitations Info  (none)          SPECIAL CARE FACTORS FREQUENCY  PT (By licensed PT)                    Contractures Contractures Info: Not present    Additional Factors Info  Code Status Code Status Info: full             Current Medications (06/19/2018):  This is the current hospital active medication list Current Facility-Administered Medications  Medication Dose Route Frequency Provider Last Rate Last Dose  . acetaminophen (TYLENOL) tablet 650 mg  650 mg Oral Q6H PRN Mayo, Allyn Kenner, MD       Or  . acetaminophen (TYLENOL) suppository 650 mg  650 mg Rectal Q6H PRN Mayo, Allyn Kenner, MD      .  amLODipine (NORVASC) tablet 10 mg  10 mg Oral Daily Mayo, Allyn Kenner, MD   10 mg at 06/19/18 0905  . aspirin chewable tablet 81 mg  81 mg Oral Daily Mayo, Allyn Kenner, MD   81 mg at 06/19/18 0905  . atorvastatin (LIPITOR) tablet 80 mg  80 mg Oral Daily Mayo, Allyn Kenner, MD   80 mg at 06/19/18 0905  . enoxaparin (LOVENOX) injection 40 mg  40 mg Subcutaneous Q24H Mayo, Allyn Kenner, MD   40 mg at 06/18/18 2124  . hydrALAZINE (APRESOLINE) injection 5 mg  5 mg Intravenous Q4H PRN Mayo, Allyn Kenner, MD      . hydrochlorothiazide (HYDRODIURIL) tablet 25 mg  25 mg Oral Daily Mayo, Allyn Kenner, MD   25 mg at 06/19/18 0905  . latanoprost (XALATAN) 0.005 % ophthalmic solution 1 drop  1 drop Both Eyes QHS Mayo, Allyn Kenner, MD   1 drop at 06/18/18 2158  . levothyroxine (SYNTHROID) tablet 75 mcg  75 mcg Oral Daily Mayo, Allyn Kenner, MD   75 mcg at 06/19/18 0504  . Melatonin TABS 5 mg  5 mg Oral QHS Mayo, Allyn Kenner, MD      . mirtazapine (REMERON) tablet 7.5 mg  7.5 mg Oral QHS Mayo, Allyn Kenner, MD   7.5 mg  at 06/18/18 2122  . ondansetron (ZOFRAN) tablet 4 mg  4 mg Oral Q6H PRN Mayo, Allyn KennerKaty Dodd, MD       Or  . ondansetron [facility](ZOFRAN) injection 4 mg  4 mg Intravenous Q6H PRN Mayo, Allyn KennerKaty Dodd, MD      . polyethylene glycol (MIRALAX / GLYCOLAX) packet 17 g  17 g Oral Daily PRN Mayo, Allyn KennerKaty Dodd, MD         Discharge Medications: Please see discharge summary for a list of discharge medications.  Relevant Imaging Results:  Relevant Lab Results:   Additional Information ss: [id number]  York SpanielMonica Ennifer Harston, LCSW

## 2018-06-20 MED ORDER — MELATONIN 3 MG PO TABS: 3.0000 mg | ORAL_TABLET | Freq: Every day | ORAL | 0 refills | Status: AC

## 2018-06-20 MED ORDER — MIRTAZAPINE 7.5 MG PO TABS: 7.5000 mg | ORAL_TABLET | Freq: Every day | ORAL | 0 refills | Status: DC

## 2018-06-20 MED ORDER — LATANOPROST 0.005 % OP SOLN: 1.0000 [drp] | Freq: Every day | OPHTHALMIC | 0 refills | Status: AC

## 2018-06-20 MED ORDER — ATORVASTATIN CALCIUM 80 MG PO TABS: 80.0000 mg | ORAL_TABLET | Freq: Every day | ORAL | 0 refills | Status: DC

## 2018-06-20 MED ORDER — HYDROCHLOROTHIAZIDE 25 MG PO TABS: 25.0000 mg | ORAL_TABLET | Freq: Every day | ORAL | 0 refills | Status: DC

## 2018-06-20 MED ORDER — ASPIRIN 81 MG PO CHEW: 81.0000 mg | CHEWABLE_TABLET | Freq: Every day | ORAL | 11 refills | Status: AC

## 2018-06-20 MED ORDER — PNEUMOCOCCAL VAC POLYVALENT 25 MCG/0.5ML IJ INJ
0.5000 mL | INJECTION | INTRAMUSCULAR | Status: AC
  Administered 2018-06-21: 11:00:00 0.5 mL via INTRAMUSCULAR
  Filled 2018-06-20: qty 0.5

## 2018-06-20 MED ORDER — POTASSIUM CHLORIDE ER 10 MEQ PO TBCR: 10.0000 meq | EXTENDED_RELEASE_TABLET | Freq: Every day | ORAL | 0 refills | Status: DC

## 2018-06-20 MED ORDER — AMLODIPINE BESYLATE 10 MG PO TABS: 10.0000 mg | ORAL_TABLET | Freq: Every day | ORAL | 0 refills | Status: DC

## 2018-06-20 MED ORDER — LEVOTHYROXINE SODIUM 75 MCG PO TABS: 75.0000 ug | ORAL_TABLET | Freq: Every day | ORAL | 0 refills | Status: DC

## 2018-06-20 NOTE — Discharge Summary (Signed)
[facility] -  at [facility]   PATIENT NAME: Sara Bailey    MR#:  [id number]  DATE OF BIRTH:  11/14/1940  DATE OF ADMISSION:  06/17/2018 ADMITTING PHYSICIAN: Campbell Stall, MD  DATE OF DISCHARGE: 06/20/2018  PRIMARY CARE PHYSICIAN: [facility]   ADMISSION DIAGNOSIS:  Dehydration [E86.0] Altered mental status, unspecified altered mental status type [R41.82] Hypertension, unspecified type [I10]  DISCHARGE DIAGNOSIS:  Active Problems:   Altered mental status Hypertension Dehydration Elevated troponin Uncontrolled hypertension Hypokalemia CKD stage III SECONDARY DIAGNOSIS:   Past Medical History:  Diagnosis Date  . Coronary artery disease   . Hypertension   . Stroke [facility])      ADMITTING HISTORY Sara Bailey  is a 78 y.o. female with a known history of CAD, hypertension, history of stroke who presented to the ED with altered mental status for the last 3 or 4 days.  Unable to obtain much history from the patient, so history provided by ED physician and family.  Patient currently living at independent living facility and has had inability to care for herself over the last couple of days due to altered mental status.  She denies any chest pain, shortness of breath, abdominal pain, fevers, chills. In the ED, she was hypertensive with BP 213/110.  Labs were significant for K 3.4, tropes 0.04, WBC 10.6.  UA with 20 ketones.  Chest x-ray negative.  CT head negative.  COVID test was negative.  Hospitalists were called for admission.  HOSPITAL COURSE:  Patient was admitted to medical floor.  Patient was worked up with CT head which showed no acute abnormality.  Received IV fluids and nephrotoxic medications were avoided.  Potassium was replaced.  Patient blood pressure was better controlled with oral Norvasc and HCTZ.  Patient had elevated troponin secondary to demand ischemia.  Cardiology consultation was done and no acute intervention  recommended.  Patient has a bed at [facility] facility and will be discharged.  Family updated.  Urine culture is contaminated.  COVID-19 test negative.  CONSULTS OBTAINED:    DRUG ALLERGIES:  No Known Allergies  DISCHARGE MEDICATIONS:   Allergies as of 06/20/2018   No Known Allergies     Medication List    STOP taking these medications   hydrochlorothiazide 12.5 MG capsule Commonly known as:  MICROZIDE Replaced by:  hydrochlorothiazide 25 MG tablet     TAKE these medications   amLODipine 10 MG tablet Commonly known as:  NORVASC Take 1 tablet (10 mg total) by mouth daily for 30 days. What changed:    medication strength  how much to take   aspirin 81 MG chewable tablet Chew 1 tablet (81 mg total) by mouth daily.   atorvastatin 80 MG tablet Commonly known as:  LIPITOR Take 1 tablet (80 mg total) by mouth daily for 30 days.   hydrochlorothiazide 25 MG tablet Commonly known as:  HYDRODIURIL Take 1 tablet (25 mg total) by mouth daily for 30 days. Start taking on:  Jun 21, 2018 Replaces:  hydrochlorothiazide 12.5 MG capsule   latanoprost 0.005 % ophthalmic solution Commonly known as:  XALATAN Place 1 drop into both eyes at bedtime for 30 days. What changed:  how to take this   levothyroxine 75 MCG tablet Commonly known as:  SYNTHROID Take 1 tablet (75 mcg total) by mouth daily for 30 days.   Melatonin 3 MG Tabs Take 1 tablet (3 mg total) by mouth at bedtime for 30 days.   mirtazapine 7.5  MG tablet Commonly known as:  REMERON Take 1 tablet (7.5 mg total) by mouth at bedtime for 30 days.   potassium chloride 10 MEQ tablet Commonly known as:  Klor-Con 10 Take 1 tablet (10 mEq total) by mouth daily for 30 days.       Today  Patient seen today Blood pressure better controlled Tolerating diet well Hemodynamically stable VITAL SIGNS:  Blood pressure 111/67, pulse 70, temperature 98.1 F (36.7 C), temperature source Oral, resp. rate 18, height 5\' 6"   (1.676 m), weight 56.7 kg, SpO2 97 %.  I/O:    Intake/Output Summary (Last 24 hours) at 06/20/2018 1204 Last data filed at 06/20/2018 1114 Gross per 24 hour  Intake 476 ml  Output 350 ml  Net 126 ml    PHYSICAL EXAMINATION:  Physical Exam  GENERAL:  78 y.o.-year-old patient lying in the bed with no acute distress.  LUNGS: Normal breath sounds bilaterally, no wheezing, rales,rhonchi or crepitation. No use of accessory muscles of respiration.  CARDIOVASCULAR: S1, S2 normal. No murmurs, rubs, or gallops.  ABDOMEN: Soft, non-tender, non-distended. Bowel sounds present. No organomegaly or mass.  NEUROLOGIC: Moves all 4 extremities. PSYCHIATRIC: The patient is alert and oriented x 3.  SKIN: No obvious rash, lesion, or ulcer.   DATA REVIEW:   CBC Recent Labs  Lab 06/18/18 0230  WBC 10.5  HGB 12.4  HCT 36.2  PLT 238    Chemistries  Recent Labs  Lab 06/17/18 1545 06/18/18 0230  NA 146* 141  K 3.4* 3.8  CL 106 108  CO2 26 24  GLUCOSE 119* 122*  BUN 23 19  CREATININE 1.17* 0.87  CALCIUM 10.9* 10.0  AST 26  --   ALT 18  --   ALKPHOS 112  --   BILITOT 1.2  --     Cardiac Enzymes Recent Labs  Lab 06/18/18 2314  TROPONINI 0.11*    Microbiology Results  Results for orders placed or performed during the hospital encounter of 06/17/18  SARS Coronavirus 2 (CEPHEID - Performed in [facility] hospital lab), Hosp Order     Status: None   Collection Time: 06/17/18 12:33 PM  Result Value Ref Range Status   SARS Coronavirus 2 NEGATIVE NEGATIVE Final    Comment: (NOTE) If result is NEGATIVE SARS-CoV-2 target nucleic acids are NOT DETECTED. The SARS-CoV-2 RNA is generally detectable in upper and lower  respiratory specimens during the acute phase of infection. The lowest  concentration of SARS-CoV-2 viral copies this assay can detect is 250  copies / mL. A negative result does not preclude SARS-CoV-2 infection  and should not be used as the sole basis for treatment or  other  patient management decisions.  A negative result may occur with  improper specimen collection / handling, submission of specimen other  than nasopharyngeal swab, presence of viral mutation(s) within the  areas targeted by this assay, and inadequate number of viral copies  (<250 copies / mL). A negative result must be combined with clinical  observations, patient history, and epidemiological information. If result is POSITIVE SARS-CoV-2 target nucleic acids are DETECTED. The SARS-CoV-2 RNA is generally detectable in upper and lower  respiratory specimens dur ing the acute phase of infection.  Positive  results are indicative of active infection with SARS-CoV-2.  Clinical  correlation with patient history and other diagnostic information is  necessary to determine patient infection status.  Positive results do  not rule out bacterial infection or co-infection with other viruses. If result is  PRESUMPTIVE POSTIVE SARS-CoV-2 nucleic acids MAY BE PRESENT.   A presumptive positive result was obtained on the submitted specimen  and confirmed on repeat testing.  While 2019 novel coronavirus  (SARS-CoV-2) nucleic acids may be present in the submitted sample  additional confirmatory testing may be necessary for epidemiological  and / or clinical management purposes  to differentiate between  SARS-CoV-2 and other Sarbecovirus currently known to infect humans.  If clinically indicated additional testing with an alternate test  methodology [phone] is advised. The SARS-CoV-2 RNA is generally  detectable in upper and lower respiratory sp ecimens during the acute  phase of infection. The expected result is Negative. Fact Sheet for Patients:  BoilerBrush.com.cyhttps://www.fda.gov/media/136312/download Fact Sheet for Healthcare Providers: https://pope.com/https://www.fda.gov/media/136313/download This test is not yet approved or cleared by the [place] FDA and has been authorized for detection and/or diagnosis of  SARS-CoV-2 by FDA under an Emergency Use Authorization (EUA).  This EUA will remain in effect (meaning this test can be used) for the duration of the COVID-19 declaration under Section 564(b)(1) of the Act, 21 U.S.C. section 360bbb-3(b)(1), unless the authorization is terminated or revoked sooner. Performed at [facility], [address] [place], [place] [address]     RADIOLOGY:  No results found.  Follow up with PCP in 1 week.  Management plans discussed with the patient, family and they are in agreement.  CODE STATUS: Full code    Code Status Orders  (From admission, onward)         Start     Ordered   06/17/18 2031  Full code  Continuous     06/17/18 2031        Code Status History    This patient has a current code status but no historical code status.      TOTAL TIME TAKING CARE OF THIS PATIENT ON DAY OF DISCHARGE: more than 35 minutes.   Ihor AustinPavan Pyreddy M.D on 06/20/2018 at 12:04 PM  Between 7am to 6pm - Pager - [phone]  After 6pm go to [email or web address] - password [email or web address]  [facility] [place] [facility]  Office  [phone]  CC: Primary care physician; [facility]  Note: This dictation was prepared with Dragon dictation along with smaller phrase technology. Any transcriptional errors that result from this process are unintentional.

## 2018-06-20 NOTE — Plan of Care (Signed)
The patient is stable at this point. No falls. Cardiac monitored removed.  Problem: Education: Goal: Knowledge of General Education information will improve Description Including pain rating scale, medication(s)/side effects and non-pharmacologic comfort measures Outcome: Completed/Met   Problem: Health Behavior/Discharge Planning: Goal: Ability to manage health-related needs will improve Outcome: Completed/Met   Problem: Clinical Measurements: Goal: Ability to maintain clinical measurements within normal limits will improve Outcome: Completed/Met Goal: Will remain free from infection Outcome: Completed/Met Goal: Diagnostic test results will improve Outcome: Completed/Met Goal: Respiratory complications will improve Outcome: Completed/Met Goal: Cardiovascular complication will be avoided Outcome: Completed/Met   Problem: Activity: Goal: Risk for activity intolerance will decrease Outcome: Completed/Met   Problem: Nutrition: Goal: Adequate nutrition will be maintained Outcome: Completed/Met   Problem: Coping: Goal: Level of anxiety will decrease Outcome: Completed/Met   Problem: Elimination: Goal: Will not experience complications related to bowel motility Outcome: Completed/Met Goal: Will not experience complications related to urinary retention Outcome: Completed/Met   Problem: Pain Managment: Goal: General experience of comfort will improve Outcome: Completed/Met   Problem: Safety: Goal: Ability to remain free from injury will improve Outcome: Completed/Met   Problem: Skin Integrity: Goal: Risk for impaired skin integrity will decrease Outcome: Completed/Met   Problem: Acute Rehab PT Goals(only PT should resolve) Goal: Pt Will Go Supine/Side To Sit Outcome: Completed/Met Goal: Patient Will Transfer Sit To/From Stand Outcome: Completed/Met Goal: Pt Will Ambulate Outcome: Completed/Met Goal: Pt/caregiver will Perform Home Exercise Program Outcome:  Completed/Met

## 2018-06-20 NOTE — TOC Progression Note (Signed)
Transition of Care [facility]) - Progression Note    Patient Details  Name: Sara Bailey MRN: [id number] Date of Birth: 02/19/1940  [facility] CM/SW Contact  [place], [place] Phone Number: 06/20/2018, 4:25 PM  Clinical Narrative:   Verlon Au with [facility] stated that they have auth but due to the time, cannot take patient this evening and will accept patient tomorrow.    Expected Discharge Plan: Skilled Nursing Facility Barriers to Discharge: Continued Medical Work up  Expected Discharge Plan and Services Expected Discharge Plan: Skilled Nursing Facility       Living arrangements for the past 2 months: Apartment Expected Discharge Date: 06/20/18                                     Social Determinants of Health (SDOH) Interventions    Readmission Risk Interventions Readmission Risk Prevention Plan 06/18/2018  Transportation Screening Complete

## 2018-06-20 NOTE — Progress Notes (Signed)
[facility] - [place] at [facility]   PATIENT NAME: Sara Bailey    MR#:  [id number]  DATE OF BIRTH:  1940/08/22  SUBJECTIVE:  CHIEF COMPLAINT:   Chief Complaint  Patient presents with  . Altered Mental Status  Seen and evaluated today Awake and alert and responds to verbal commands No complaints of any chest pain Has elevated troponins  REVIEW OF SYSTEMS:    ROS Review of Systems  Constitutional: Generalized weakness HENT: Negative.   Eyes: Negative.   Respiratory: Negative.   Cardiovascular: Negative.   Gastrointestinal: Negative.   Genitourinary: Negative.   Musculoskeletal: Negative.   Skin: Negative.   Neurological: Negative.   Endo/Heme/Allergies: Negative.   Psychiatric/Behavioral: Negative.   All other systems reviewed and are negative.  DRUG ALLERGIES:  No Known Allergies  VITALS:  Blood pressure 111/67, pulse 70, temperature 98.1 F (36.7 C), temperature source Oral, resp. rate 18, height 5\' 6"  (1.676 m), weight 56.7 kg, SpO2 97 %.  PHYSICAL EXAMINATION:   Physical Exam  GENERAL:  78 y.o.-year-old patient lying in the bed with no acute distress.  EYES: Pupils equal, round, reactive to light and accommodation. No scleral icterus. Extraocular muscles intact.  HEENT: Head atraumatic, normocephalic. Oropharynx and nasopharynx clear.  NECK:  Supple, no jugular venous distention. No thyroid enlargement, no tenderness.  LUNGS: Normal breath sounds bilaterally, no wheezing, rales, rhonchi. No use of accessory muscles of respiration.  CARDIOVASCULAR: S1, S2 normal. No murmurs, rubs, or gallops.  ABDOMEN: Soft, nontender, nondistended. Bowel sounds present. No organomegaly or mass.  EXTREMITIES: No cyanosis, clubbing or edema b/l.    NEUROLOGIC: Awake Moves all extremities PSYCHIATRIC: The patient is alert and oriented x 2  SKIN: No obvious rash, lesion, or ulcer.   LABORATORY PANEL:   CBC Recent Labs  Lab 06/18/18 0230  WBC 10.5  HGB  12.4  HCT 36.2  PLT 238   ------------------------------------------------------------------------------------------------------------------ Chemistries  Recent Labs  Lab 06/17/18 1545 06/18/18 0230  NA 146* 141  K 3.4* 3.8  CL 106 108  CO2 26 24  GLUCOSE 119* 122*  BUN 23 19  CREATININE 1.17* 0.87  CALCIUM 10.9* 10.0  AST 26  --   ALT 18  --   ALKPHOS 112  --   BILITOT 1.2  --    ------------------------------------------------------------------------------------------------------------------  Cardiac Enzymes Recent Labs  Lab 06/18/18 2314  TROPONINI 0.11*   ------------------------------------------------------------------------------------------------------------------  RADIOLOGY:  No results found.   ASSESSMENT AND PLAN:   78 year old elderly female patient with history of hypertension, CVA, coronary artery disease currently under hospitalist service for confusion  -Acute encephalopathy improved  dementia stable CT head no acute abnormality  -Hypertension Better controlled New Norvasc 10 mg daily Continue HCTZ 25 mg daily IV hydralazine as needed  -Acute hypokalemia improved Potassium replaced  -CKD stage III Creatinine back to baseline  Back to baseline Avoid nephrotoxic meds  -History of CVA Continue aspirin and Lipitor  -Elevated troponin Could be from demand ischemia Cardiology  Evaluation appreciated No acute intervention recommended  -Social worker follow-up for placement [facility] placement awaiting  -Family updated  All the records are reviewed and case discussed with Care Management/Social Worker. Management plans discussed with the patient, family and they are in agreement.  CODE STATUS: Full code  DVT Prophylaxis: SCDs  TOTAL TIME TAKING CARE OF THIS PATIENT: 35 minutes.   POSSIBLE D/C IN 2 to 3 DAYS, DEPENDING ON CLINICAL CONDITION.  Ihor Austin M.D on 06/20/2018 at 2:03 PM  Between  7am to 6pm - Pager -  [phone]  After 6pm go to [email or web address] - password [email or web address]  [facility] [place] [facility]  Office  [phone]  CC: Primary care physician; [facility]  Note: This dictation was prepared with Dragon dictation along with smaller phrase technology. Any transcriptional errors that result from this process are unintentional.

## 2018-06-20 NOTE — Care Management Important Message (Signed)
Important Message  Patient Details  Name: Sara Bailey MRN: [id number] Date of Birth: 09/23/1940   Medicare Important Message Given:  Yes    [name] 06/20/2018, 10:54 AM

## 2018-06-20 NOTE — TOC Progression Note (Signed)
Transition of Care [facility]) - Progression Note    Patient Details  Name: Sara Bailey MRN: [id number] Date of Birth: 12/23/1940  [facility] CM/SW Contact  [place], [place] Phone Number: 06/20/2018, 1:48 PM  Clinical Narrative:   CSW initially contacted patient's brother however CSW learned that he was in the ICU at [facility] after having a stroke Monday. CSW was able to find patient's brother's wife's number and contacted her. CSW extended the bed offers and she chose [facility]. Verlon Au at [facility] aware and is working on obtaining prior [place].     Expected Discharge Plan: Skilled Nursing Facility Barriers to Discharge: Continued Medical Work up  Expected Discharge Plan and Services Expected Discharge Plan: Skilled Nursing Facility       Living arrangements for the past 2 months: Apartment Expected Discharge Date: 06/20/18                                     Social Determinants of Health (SDOH) Interventions    Readmission Risk Interventions Readmission Risk Prevention Plan 06/18/2018  Transportation Screening Complete

## 2018-06-20 NOTE — Plan of Care (Signed)
Covid tested for facility placement. Awaiting results. Monitoring urine output. RN shift notified.  Problem: Education: Goal: Knowledge of General Education information will improve Description Including pain rating scale, medication(s)/side effects and non-pharmacologic comfort measures Outcome: Completed/Met   Problem: Health Behavior/Discharge Planning: Goal: Ability to manage health-related needs will improve Outcome: Completed/Met   Problem: Clinical Measurements: Goal: Ability to maintain clinical measurements within normal limits will improve Outcome: Completed/Met Goal: Will remain free from infection Outcome: Completed/Met Goal: Diagnostic test results will improve Outcome: Completed/Met Goal: Respiratory complications will improve Outcome: Completed/Met Goal: Cardiovascular complication will be avoided Outcome: Completed/Met   Problem: Activity: Goal: Risk for activity intolerance will decrease Outcome: Completed/Met   Problem: Nutrition: Goal: Adequate nutrition will be maintained Outcome: Completed/Met   Problem: Coping: Goal: Level of anxiety will decrease Outcome: Completed/Met   Problem: Elimination: Goal: Will not experience complications related to bowel motility Outcome: Completed/Met Goal: Will not experience complications related to urinary retention Outcome: Completed/Met   Problem: Pain Managment: Goal: General experience of comfort will improve Outcome: Completed/Met   Problem: Safety: Goal: Ability to remain free from injury will improve Outcome: Completed/Met   Problem: Skin Integrity: Goal: Risk for impaired skin integrity will decrease Outcome: Completed/Met   Problem: Acute Rehab PT Goals(only PT should resolve) Goal: Pt Will Go Supine/Side To Sit Outcome: Completed/Met Goal: Patient Will Transfer Sit To/From Stand Outcome: Completed/Met Goal: Pt Will Ambulate Outcome: Completed/Met Goal: Pt/caregiver will Perform Home Exercise  Program Outcome: Completed/Met

## 2018-06-21 LAB — SARS CORONAVIRUS 2 BY RT PCR (HOSPITAL ORDER, PERFORMED IN ~~LOC~~ HOSPITAL LAB): SARS Coronavirus 2: NEGATIVE

## 2018-06-21 LAB — CREATININE, SERUM
Creatinine, Ser: 1.19 mg/dL — ABNORMAL HIGH (ref 0.44–1.00)
GFR calc Af Amer: 50 mL/min — ABNORMAL LOW (ref >60)
GFR calc non Af Amer: 43 mL/min — ABNORMAL LOW (ref >60)

## 2018-06-21 NOTE — TOC Transition Note (Addendum)
Transition of Care [facility]) - CM/SW Discharge Note   Patient Details  Name: Sara Bailey MRN: [id number] Date of Birth: 09-03-1940  [facility] CM/SW Contact:  Chapman Fitch, RN Phone Number: 06/21/2018, 11:37 AM   Clinical Narrative:    Patient to discharge to [facility] today.  RNCM spoke with Verlon Au at [facility] and confirmed.  Negative covid results faxed.   EMS packet, and number to call report on chart.  Patient to discharge to room 511.   Regina at [facility] updated   Final next level of care: Skilled Nursing Facility Barriers to Discharge: Barriers Resolved   Patient Goals and CMS Choice        Discharge Placement              Patient chooses bed at: [facility] Patient to be transferred to facility by: EMS Name of family member notified: Bedside RN contacted sister in law  Patient and family notified of of transfer: 06/21/18  Discharge Plan and Services                                     Social Determinants of Health (SDOH) Interventions     Readmission Risk Interventions Readmission Risk Prevention Plan 06/18/2018  Transportation Screening Complete

## 2018-06-21 NOTE — Progress Notes (Signed)
MD notified: The wrong corona virus test was order as the results have not come back.

## 2018-06-21 NOTE — Progress Notes (Addendum)
The patient has been has been discharged. Report given to Bonita Quin, LPN at [facility]. Messaged left for Talbert Forest of the patient being transfer to SNF.

## 2018-06-21 NOTE — Discharge Summary (Signed)
[employer] - [place] at [facility]   PATIENT NAME: Sara Bailey    MR#:  [id number]  DATE OF BIRTH:  [birth year]  DATE OF ADMISSION:  06/17/2018 ADMITTING PHYSICIAN: Campbell Stall, MD  DATE OF DISCHARGE: 06/21/2018  PRIMARY CARE PHYSICIAN: [facility]   ADMISSION DIAGNOSIS:  Dehydration [E86.0] Altered mental status, unspecified altered mental status type [R41.82] Hypertension, unspecified type [I10]  DISCHARGE DIAGNOSIS:  Active Problems:   Altered mental status Hypertension Dehydration Elevated troponin Uncontrolled hypertension Hypokalemia CKD stage III SECONDARY DIAGNOSIS:   Past Medical History:  Diagnosis Date  . Coronary artery disease   . Hypertension   . Stroke [facility])      ADMITTING HISTORY Sara Bailey  is a 78 y.o. female with a known history of CAD, hypertension, history of stroke who presented to the ED with altered mental status for the last 3 or 4 days.  Unable to obtain much history from the patient, so history provided by ED physician and family.  Patient currently living at independent living facility and has had inability to care for herself over the last couple of days due to altered mental status.  She denies any chest pain, shortness of breath, abdominal pain, fevers, chills. In the ED, she was hypertensive with BP 213/110.  Labs were significant for K 3.4, tropes 0.04, WBC 10.6.  UA with 20 ketones.  Chest x-ray negative.  CT head negative.  COVID test was negative.  Hospitalists were called for admission.  HOSPITAL COURSE:  Patient was admitted to medical floor.  Patient was worked up with CT head which showed no acute abnormality.  Received IV fluids and nephrotoxic medications were avoided.  Potassium was replaced.  Patient blood pressure was better controlled with oral Norvasc and HCTZ.  Patient had elevated troponin secondary to demand ischemia.  Cardiology consultation was done and no acute intervention  recommended.  Patient has a bed at [facility] facility and will be discharged.  Family updated.  Urine culture is contaminated.  COVID-19 test negative.  CONSULTS OBTAINED:    DRUG ALLERGIES:  No Known Allergies  DISCHARGE MEDICATIONS:   Allergies as of 06/21/2018   No Known Allergies     Medication List    STOP taking these medications   hydrochlorothiazide 12.5 MG capsule Commonly known as:  MICROZIDE Replaced by:  hydrochlorothiazide 25 MG tablet     TAKE these medications   amLODipine 10 MG tablet Commonly known as:  NORVASC Take 1 tablet (10 mg total) by mouth daily for 30 days. What changed:    medication strength  how much to take   aspirin 81 MG chewable tablet Chew 1 tablet (81 mg total) by mouth daily.   atorvastatin 80 MG tablet Commonly known as:  LIPITOR Take 1 tablet (80 mg total) by mouth daily for 30 days.   hydrochlorothiazide 25 MG tablet Commonly known as:  HYDRODIURIL Take 1 tablet (25 mg total) by mouth daily for 30 days. Replaces:  hydrochlorothiazide 12.5 MG capsule   latanoprost 0.005 % ophthalmic solution Commonly known as:  XALATAN Place 1 drop into both eyes at bedtime for 30 days. What changed:  how to take this   levothyroxine 75 MCG tablet Commonly known as:  SYNTHROID Take 1 tablet (75 mcg total) by mouth daily for 30 days.   Melatonin 3 MG Tabs Take 1 tablet (3 mg total) by mouth at bedtime for 30 days.   mirtazapine 7.5 MG tablet Commonly known as:  REMERON  Take 1 tablet (7.5 mg total) by mouth at bedtime for 30 days.   potassium chloride 10 MEQ tablet Commonly known as:  Klor-Con 10 Take 1 tablet (10 mEq total) by mouth daily for 30 days.       Today  Patient seen today Blood pressure better controlled Tolerating diet well Hemodynamically stable VITAL SIGNS:  Blood pressure 123/61, pulse (!) 57, temperature (!) 97.5 F (36.4 C), temperature source Oral, resp. rate 18, height 5\' 6"  (1.676 m), weight 56.7  kg, SpO2 99 %.  I/O:    Intake/Output Summary (Last 24 hours) at 06/21/2018 0952 Last data filed at 06/21/2018 0520 Gross per 24 hour  Intake 276 ml  Output 400 ml  Net -124 ml    PHYSICAL EXAMINATION:  Physical Exam  GENERAL:  78 y.o.-year-old patient lying in the bed with no acute distress.  LUNGS: Normal breath sounds bilaterally, no wheezing, rales,rhonchi or crepitation. No use of accessory muscles of respiration.  CARDIOVASCULAR: S1, S2 normal. No murmurs, rubs, or gallops.  ABDOMEN: Soft, non-tender, non-distended. Bowel sounds present. No organomegaly or mass.  NEUROLOGIC: Moves all 4 extremities. PSYCHIATRIC: The patient is alert and oriented x 3.  SKIN: No obvious rash, lesion, or ulcer.   DATA REVIEW:   CBC Recent Labs  Lab 06/18/18 0230  WBC 10.5  HGB 12.4  HCT 36.2  PLT 238    Chemistries  Recent Labs  Lab 06/17/18 1545 06/18/18 0230 06/21/18 0623  NA 146* 141  --   K 3.4* 3.8  --   CL 106 108  --   CO2 26 24  --   GLUCOSE 119* 122*  --   BUN 23 19  --   CREATININE 1.17* 0.87 1.19*  CALCIUM 10.9* 10.0  --   AST 26  --   --   ALT 18  --   --   ALKPHOS 112  --   --   BILITOT 1.2  --   --     Cardiac Enzymes Recent Labs  Lab 06/18/18 2314  TROPONINI 0.11*    Microbiology Results  Results for orders placed or performed during the hospital encounter of 06/17/18  SARS Coronavirus 2 (CEPHEID - Performed in [facility] hospital lab), Hosp Order     Status: None   Collection Time: 06/17/18 12:33 PM  Result Value Ref Range Status   SARS Coronavirus 2 NEGATIVE NEGATIVE Final    Comment: (NOTE) If result is NEGATIVE SARS-CoV-2 target nucleic acids are NOT DETECTED. The SARS-CoV-2 RNA is generally detectable in upper and lower  respiratory specimens during the acute phase of infection. The lowest  concentration of SARS-CoV-2 viral copies this assay can detect is 250  copies / mL. A negative result does not preclude SARS-CoV-2 infection  and  should not be used as the sole basis for treatment or other  patient management decisions.  A negative result may occur with  improper specimen collection / handling, submission of specimen other  than nasopharyngeal swab, presence of viral mutation(s) within the  areas targeted by this assay, and inadequate number of viral copies  (<250 copies / mL). A negative result must be combined with clinical  observations, patient history, and epidemiological information. If result is POSITIVE SARS-CoV-2 target nucleic acids are DETECTED. The SARS-CoV-2 RNA is generally detectable in upper and lower  respiratory specimens dur ing the acute phase of infection.  Positive  results are indicative of active infection with SARS-CoV-2.  Clinical  correlation with patient  history and other diagnostic information is  necessary to determine patient infection status.  Positive results do  not rule out bacterial infection or co-infection with other viruses. If result is PRESUMPTIVE POSTIVE SARS-CoV-2 nucleic acids MAY BE PRESENT.   A presumptive positive result was obtained on the submitted specimen  and confirmed on repeat testing.  While 2019 novel coronavirus  (SARS-CoV-2) nucleic acids may be present in the submitted sample  additional confirmatory testing may be necessary for epidemiological  and / or clinical management purposes  to differentiate between  SARS-CoV-2 and other Sarbecovirus currently known to infect humans.  If clinically indicated additional testing with an alternate test  methodology [phone]) is advised. The SARS-CoV-2 RNA is generally  detectable in upper and lower respiratory sp ecimens during the acute  phase of infection. The expected result is Negative. Fact Sheet for Patients:  BoilerBrush.com.cy Fact Sheet for Healthcare Providers: https://pope.com/ This test is not yet approved or cleared by the [place] FDA and has been  authorized for detection and/or diagnosis of SARS-CoV-2 by FDA under an Emergency Use Authorization (EUA).  This EUA will remain in effect (meaning this test can be used) for the duration of the COVID-19 declaration under Section 564(b)(1) of the Act, 21 U.S.C. section 360bbb-3(b)(1), unless the authorization is terminated or revoked sooner. Performed at [facility], [address] [place], [place] [address]   SARS Coronavirus 2 (CEPHEID - Performed in [facility] hospital lab), Hosp Order     Status: None   Collection Time: 06/20/18  5:51 PM  Result Value Ref Range Status   SARS Coronavirus 2 NEGATIVE NEGATIVE Final    Comment: (NOTE) If result is NEGATIVE SARS-CoV-2 target nucleic acids are NOT DETECTED. The SARS-CoV-2 RNA is generally detectable in upper and lower  respiratory specimens during the acute phase of infection. The lowest  concentration of SARS-CoV-2 viral copies this assay can detect is 250  copies / mL. A negative result does not preclude SARS-CoV-2 infection  and should not be used as the sole basis for treatment or other  patient management decisions.  A negative result may occur with  improper specimen collection / handling, submission of specimen other  than nasopharyngeal swab, presence of viral mutation(s) within the  areas targeted by this assay, and inadequate number of viral copies  (<250 copies / mL). A negative result must be combined with clinical  observations, patient history, and epidemiological information. If result is POSITIVE SARS-CoV-2 target nucleic acids are DETECTED. The SARS-CoV-2 RNA is generally detectable in upper and lower  respiratory specimens dur ing the acute phase of infection.  Positive  results are indicative of active infection with SARS-CoV-2.  Clinical  correlation with patient history and other diagnostic information is  necessary to determine patient infection status.  Positive results do  not rule out bacterial  infection or co-infection with other viruses. If result is PRESUMPTIVE POSTIVE SARS-CoV-2 nucleic acids MAY BE PRESENT.   A presumptive positive result was obtained on the submitted specimen  and confirmed on repeat testing.  While 2019 novel coronavirus  (SARS-CoV-2) nucleic acids may be present in the submitted sample  additional confirmatory testing may be necessary for epidemiological  and / or clinical management purposes  to differentiate between  SARS-CoV-2 and other Sarbecovirus currently known to infect humans.  If clinically indicated additional testing with an alternate test  methodology [phone]) is advised. The SARS-CoV-2 RNA is generally  detectable in upper and lower respiratory sp ecimens during  the acute  phase of infection. The expected result is Negative. Fact Sheet for Patients:  BoilerBrush.com.cy Fact Sheet for Healthcare Providers: https://pope.com/ This test is not yet approved or cleared by the [place] FDA and has been authorized for detection and/or diagnosis of SARS-CoV-2 by FDA under an Emergency Use Authorization (EUA).  This EUA will remain in effect (meaning this test can be used) for the duration of the COVID-19 declaration under Section 564(b)(1) of the Act, 21 U.S.C. section 360bbb-3(b)(1), unless the authorization is terminated or revoked sooner. Performed at [facility], [address], [place], [place] [address]     RADIOLOGY:  No results found.  Follow up with PCP in 1 week.  Management plans discussed with the patient, family and they are in agreement.  CODE STATUS: Full code    Code Status Orders  (From admission, onward)         Start     Ordered   06/17/18 2031  Full code  Continuous     06/17/18 2031        Code Status History    This patient has a current code status but no historical code status.      TOTAL TIME TAKING CARE OF THIS PATIENT ON DAY OF  DISCHARGE: more than 35 minutes.   Ihor Austin M.D on 06/21/2018 at 9:52 AM  Between 7am to 6pm - Pager - [phone]  After 6pm go to [email or web address] - password [email or web address]  [facility] [place] [facility]  Office  [phone]  CC: Primary care physician; [facility]  Note: This dictation was prepared with Dragon dictation along with smaller phrase technology. Any transcriptional errors that result from this process are unintentional.

## 2018-06-22 DIAGNOSIS — G9349 Other encephalopathy: Secondary | ICD-10-CM | POA: Insufficient documentation

## 2019-01-17 ENCOUNTER — Emergency Department
Admission: EM | Admit: 2019-01-17 | Discharge: 2019-01-17 | Disposition: A | Discharge status: Home / Self Care | Payer: Medicare Other | Attending: Emergency Medicine | Admitting: Emergency Medicine

## 2019-01-17 ENCOUNTER — Other Ambulatory Visit: Payer: Self-pay

## 2019-01-17 ENCOUNTER — Emergency Department: Payer: Medicare Other

## 2019-01-17 ENCOUNTER — Encounter: Payer: Self-pay | Admitting: Emergency Medicine

## 2019-01-17 DIAGNOSIS — W01198A Fall on same level from slipping, tripping and stumbling with subsequent striking against other object, initial encounter: Secondary | ICD-10-CM | POA: Insufficient documentation

## 2019-01-17 DIAGNOSIS — Z87891 Personal history of nicotine dependence: Secondary | ICD-10-CM | POA: Diagnosis not present

## 2019-01-17 DIAGNOSIS — I2510 Atherosclerotic heart disease of native coronary artery without angina pectoris: Secondary | ICD-10-CM | POA: Diagnosis not present

## 2019-01-17 DIAGNOSIS — Z8673 Personal history of transient ischemic attack (TIA), 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 residual deficits: Secondary | ICD-10-CM | POA: Insufficient documentation

## 2019-01-17 DIAGNOSIS — I10 Essential (primary) hypertension: Secondary | ICD-10-CM | POA: Insufficient documentation

## 2019-01-17 DIAGNOSIS — Z79899 Other long term (current) drug therapy: Secondary | ICD-10-CM | POA: Diagnosis not present

## 2019-01-17 DIAGNOSIS — S098XXA Other specified injuries of head, initial encounter: Secondary | ICD-10-CM | POA: Insufficient documentation

## 2019-01-17 DIAGNOSIS — W19XXXA Unspecified fall, initial encounter: Secondary | ICD-10-CM

## 2019-01-17 DIAGNOSIS — Z7982 Long term (current) use of aspirin: Secondary | ICD-10-CM | POA: Diagnosis not present

## 2019-01-17 DIAGNOSIS — Y999 Unspecified external cause status: Secondary | ICD-10-CM | POA: Insufficient documentation

## 2019-01-17 DIAGNOSIS — Y929 Unspecified place or not applicable: Secondary | ICD-10-CM | POA: Insufficient documentation

## 2019-01-17 DIAGNOSIS — Y9389 Activity, other specified: Secondary | ICD-10-CM | POA: Diagnosis not present

## 2019-01-17 NOTE — ED Provider Notes (Signed)
[facility] Emergency Department Provider Note  ____________________________________________  Time seen: Approximately 2:30 PM  I have reviewed the triage vital signs and the nursing notes.   HISTORY  Chief Complaint Fall    HPI Sara Bailey is a 78 y.o. female that presents to the emergency department for evaluation after fall today.  Patient states that she does not usually walk but today she was feeling well so got up using her walker and was trying to reach her wheelchair when she lost her balance and fell.  She did hit the left side of her head.  She does not lose consciousness.  Patient states that the fall really was minor and she tried to hurry to stand back up but her brother came quickly to help her.  Patient states that she has been unstable on her feet after a stroke 3 months ago.  Her brother is her power of attorney and wanted her to come to the emergency department for evaluated, although the patient did not want to come.  Patient denies any pain.  She states that she never had any pain after the fall.  She did not have any unusual symptoms, such as dizziness, shortness of breath, chest pain prior to the fall.  She only lost her footing.  She denies any concerns currently.  No headache, neck pain, hip pain.  Past Medical History:  Diagnosis Date  . Coronary artery disease   . Hypertension   . Stroke [facility])     Patient Active Problem List   Diagnosis Date Noted  . Altered mental status 06/17/2018    History reviewed. No pertinent surgical history.  Prior to Admission medications   Medication Sig Start Date End Date Taking? Authorizing Provider  amLODipine (NORVASC) 10 MG tablet Take 1 tablet (10 mg total) by mouth daily for 30 days. 06/20/18 07/20/18  Saundra Shelling, MD  aspirin 81 MG chewable tablet Chew 1 tablet (81 mg total) by mouth daily. 06/20/18 06/20/19  Saundra Shelling, MD  atorvastatin (LIPITOR) 80 MG tablet Take 1 tablet (80 mg total) by  mouth daily for 30 days. 06/20/18 07/20/18  Saundra Shelling, MD  hydrochlorothiazide (HYDRODIURIL) 25 MG tablet Take 1 tablet (25 mg total) by mouth daily for 30 days. 06/21/18 07/21/18  Saundra Shelling, MD  levothyroxine (SYNTHROID) 75 MCG tablet Take 1 tablet (75 mcg total) by mouth daily for 30 days. 06/20/18 07/20/18  Saundra Shelling, MD  mirtazapine (REMERON) 7.5 MG tablet Take 1 tablet (7.5 mg total) by mouth at bedtime for 30 days. 06/20/18 07/20/18  Saundra Shelling, MD  potassium chloride (KLOR-CON 10) 10 MEQ tablet Take 1 tablet (10 mEq total) by mouth daily for 30 days. 06/20/18 07/20/18  Saundra Shelling, MD    Allergies Patient has no known allergies.  No family history on file.  Social History Social History   Tobacco Use  . Smoking status: Former Research scientist (life sciences)  . Smokeless tobacco: Never Used  Substance Use Topics  . Alcohol use: Never  . Drug use: Never     Review of Systems  Cardiovascular: No chest pain. Respiratory: No SOB. Gastrointestinal: No abdominal pain.  No nausea, no vomiting.  Musculoskeletal: Negative for musculoskeletal pain. Skin: Negative for rash, abrasions, lacerations, ecchymosis. Neurological: Negative for headaches, numbness or tingling   ____________________________________________   PHYSICAL EXAM:  VITAL SIGNS: ED Triage Vitals  Enc Vitals Group     BP 01/17/19 1258 137/64     Pulse Rate 01/17/19 1258 (!) 54  Resp 01/17/19 1258 20     Temp 01/17/19 1258 98.2 F (36.8 C)     Temp Source 01/17/19 1258 Axillary     SpO2 01/17/19 1258 100 %     Weight 01/17/19 1258 125 lb (56.7 kg)     Height 01/17/19 1258 5\' 7"  (1.702 m)     Head Circumference --      Peak Flow --      Pain Score 01/17/19 1314 0     Pain Loc --      Pain Edu? --      Excl. in GC? --      Constitutional: Alert and oriented. Well appearing and in no acute distress. Eyes: Conjunctivae are normal. PERRL. EOMI. Head: Minimal swelling to left lateral scalp. ENT:      Ears:       Nose: No congestion/rhinnorhea.      Mouth/Throat: Mucous membranes are moist.  Neck: No stridor.  No cervical spine tenderness to palpation. Cardiovascular: Normal rate, regular rhythm.  Good peripheral circulation. Respiratory: Normal respiratory effort without tachypnea or retractions. Lungs CTAB. Good air entry to the bases with no decreased or absent breath sounds. Gastrointestinal:  Soft and nontender to palpation. No guarding or rigidity. No palpable masses. No distention.  Musculoskeletal: Full range of motion to all extremities. No gross deformities appreciated.  Full range of motion of bilateral hips.  Weightbearing. Neurologic:  Normal speech and language. No gross focal neurologic deficits are appreciated.  Skin:  Skin is warm, dry and intact. No rash noted. Psychiatric: Mood and affect are normal. Speech and behavior are normal. Patient exhibits appropriate insight and judgement.   ____________________________________________   LABS (all labs ordered are listed, but only abnormal results are displayed)  Labs Reviewed - No data to display ____________________________________________  EKG   ____________________________________________  RADIOLOGY Lexine BatonI, Jabari Swoveland, personally viewed and evaluated these images (plain radiographs) as part of my medical decision making, as well as reviewing the written report by the radiologist.  CT Head Wo Contrast  Result Date: 01/17/2019 CLINICAL DATA:  Patient states she was reaching for something and was using her stand up walker and she was trying to pull a chair next to her and she slipped and fell. Patient states she hit her head on the floor. Patient states, "I didn't hit it hard." Patient denies passing out. Patient denies any pain. EXAM: CT HEAD WITHOUT CONTRAST TECHNIQUE: Contiguous axial images were obtained from the base of the skull through the vertex without intravenous contrast. COMPARISON:  06/17/2018. FINDINGS: Brain: No  evidence of acute infarction, hemorrhage, hydrocephalus, extra-axial collection or mass lesion/mass effect. There is ventricular sulcal enlargement reflecting mild to moderate atrophy, stable from prior study. Patchy white matter hypoattenuation is noted bilaterally consistent moderate chronic microvascular ischemic change. Small old lacunar infarct noted in the anterolateral left thalamus near the genu of the internal capsule. Vascular: No hyperdense vessel or unexpected calcification. Skull: Normal. Negative for fracture or focal lesion. Sinuses/Orbits: Visualized globes and orbits are unremarkable. The visualized sinuses and mastoid air cells are clear. Other: Small contusion over the right lateral scalp. IMPRESSION: 1. No acute intracranial abnormalities. 2. Atrophy and chronic microvascular ischemic change, stable prior study. Old left thalamic lacunar infarct. 3. Small right lateral scalp contusion. Electronically Signed   By: Amie Portlandavid  Ormond M.D.   On: 01/17/2019 13:43    ____________________________________________    PROCEDURES  Procedure(s) performed:    Procedures    Medications - No data to display  ____________________________________________   INITIAL IMPRESSION / ASSESSMENT AND PLAN / ED COURSE  Pertinent labs & imaging results that were available during my care of the patient were reviewed by me and considered in my medical decision making (see chart for details).  Review of the [place] CSRS was performed in accordance of the [facility] prior to dispensing any controlled drugs.     Patient presented to the emergency department for evaluation after fall today.  Vital signs and exam are reassuring.  Patient appears quite well and is very talkative.  Her head CT is negative for acute abnormalities.  Patient denies any complaints or concerns today.  Patient denies any pain.  Patient is to follow up with primary care as directed. Patient is given ED precautions to return to the ED for  any worsening or new symptoms.  Kechia Yahnke was evaluated in Emergency Department on 01/17/2019 for the symptoms described in the history of present illness. She was evaluated in the context of the global COVID-19 pandemic, which necessitated consideration that the patient might be at risk for infection with the SARS-CoV-2 virus that causes COVID-19. Institutional protocols and algorithms that pertain to the evaluation of patients at risk for COVID-19 are in a state of rapid change based on information released by regulatory bodies including the CDC and federal and state organizations. These policies and algorithms were followed during the patient's care in the ED.   ____________________________________________  FINAL CLINICAL IMPRESSION(S) / ED DIAGNOSES  Final diagnoses:  Fall, initial encounter      NEW MEDICATIONS STARTED DURING THIS VISIT:  ED Discharge Orders    None          This chart was dictated using voice recognition software/Dragon. Despite best efforts to proofread, errors can occur which can change the meaning. Any change was purely unintentional.    Enid Derry, PA-C 01/17/19 1554    Emily Filbert, MD 01/18/19 [phone]

## 2019-01-17 NOTE — Discharge Instructions (Addendum)
Your CT scan does not show any injury from your fall.  Please call your primary care provider for a follow-up appointment.  Please return to the emergency department for any new or concerning symptoms.

## 2019-01-17 NOTE — ED Triage Notes (Signed)
Patient states she was reaching for something and was using her stand up walker and she was trying to pull a chair next to her and she slipped and fell.  Patient states she hit her head on the floor.  Patient states, "I didn't hit it hard."  Patient denies passing out.  Patient denies any pain.

## 2019-01-17 NOTE — ED Notes (Addendum)
Patient taken to bathroom to void.  She is somewhat irritated that she had to come here.  Says  She is not injured.  She was able to get to toilet using the grab bars.  She is able to stand and move.

## 2019-01-17 NOTE — ED Triage Notes (Signed)
EMS from [facility]--fall with no complaints, vss.  Has healthcare POA, and they wanted her to come.  Patient is oriented.

## 2019-01-17 NOTE — ED Notes (Signed)
See triage note  Presents s/p fall  States she fell when she was going to bathroom  Denies any pain

## 2020-07-10 ENCOUNTER — Emergency Department: Payer: Medicare PPO

## 2020-07-10 ENCOUNTER — Encounter: Payer: Self-pay | Admitting: Physician Assistant

## 2020-07-10 ENCOUNTER — Other Ambulatory Visit: Payer: Self-pay

## 2020-07-10 ENCOUNTER — Observation Stay
Admission: EM | Admit: 2020-07-10 | Discharge: 2020-07-13 | Disposition: A | Discharge status: Home / Self Care | Payer: Medicare PPO | Attending: Emergency Medicine | Admitting: Emergency Medicine

## 2020-07-10 ENCOUNTER — Observation Stay: Payer: Medicare PPO

## 2020-07-10 DIAGNOSIS — Z20822 Contact with and (suspected) exposure to covid-19: Secondary | ICD-10-CM | POA: Diagnosis not present

## 2020-07-10 DIAGNOSIS — I129 Hypertensive chronic kidney disease with stage 1 through stage 4 chronic kidney disease, or unspecified chronic kidney disease: Secondary | ICD-10-CM | POA: Diagnosis not present

## 2020-07-10 DIAGNOSIS — N1831 Chronic kidney disease, stage 3a: Secondary | ICD-10-CM

## 2020-07-10 DIAGNOSIS — R2241 Localized swelling, mass and lump, right lower limb: Secondary | ICD-10-CM | POA: Diagnosis not present

## 2020-07-10 DIAGNOSIS — I10 Essential (primary) hypertension: Secondary | ICD-10-CM

## 2020-07-10 DIAGNOSIS — I2510 Atherosclerotic heart disease of native coronary artery without angina pectoris: Secondary | ICD-10-CM

## 2020-07-10 DIAGNOSIS — Z79899 Other long term (current) drug therapy: Secondary | ICD-10-CM | POA: Diagnosis not present

## 2020-07-10 DIAGNOSIS — R4182 Altered mental status, unspecified: Principal | ICD-10-CM

## 2020-07-10 DIAGNOSIS — H409 Unspecified glaucoma: Secondary | ICD-10-CM | POA: Diagnosis present

## 2020-07-10 DIAGNOSIS — R410 Disorientation, unspecified: Secondary | ICD-10-CM

## 2020-07-10 DIAGNOSIS — R7303 Prediabetes: Secondary | ICD-10-CM

## 2020-07-10 DIAGNOSIS — E039 Hypothyroidism, unspecified: Secondary | ICD-10-CM | POA: Diagnosis not present

## 2020-07-10 DIAGNOSIS — Z87891 Personal history of nicotine dependence: Secondary | ICD-10-CM | POA: Diagnosis not present

## 2020-07-10 DIAGNOSIS — F0390 Unspecified dementia without behavioral disturbance: Secondary | ICD-10-CM | POA: Diagnosis not present

## 2020-07-10 DIAGNOSIS — I422 Other hypertrophic cardiomyopathy: Secondary | ICD-10-CM | POA: Insufficient documentation

## 2020-07-10 DIAGNOSIS — F03918 Unspecified dementia, unspecified severity, with other behavioral disturbance: Secondary | ICD-10-CM | POA: Diagnosis present

## 2020-07-10 LAB — BRAIN NATRIURETIC PEPTIDE: B Natriuretic Peptide: 58.5 pg/mL (ref 0.0–100.0)

## 2020-07-10 LAB — RESP PANEL BY RT-PCR (FLU A&B, COVID) ARPGX2
Influenza A by PCR: NEGATIVE
Influenza B by PCR: NEGATIVE
SARS Coronavirus 2 by RT PCR: NEGATIVE

## 2020-07-10 LAB — URINALYSIS, COMPLETE (UACMP) WITH MICROSCOPIC
Bilirubin Urine: NEGATIVE
Glucose, UA: NEGATIVE mg/dL
Hgb urine dipstick: NEGATIVE
Ketones, ur: NEGATIVE mg/dL
Leukocytes,Ua: NEGATIVE
Nitrite: NEGATIVE
Protein, ur: NEGATIVE mg/dL
Specific Gravity, Urine: 1.006 (ref 1.005–1.030)
pH: 6 (ref 5.0–8.0)

## 2020-07-10 LAB — CBC WITH DIFFERENTIAL/PLATELET
Abs Immature Granulocytes: 0.02 10*3/uL (ref 0.00–0.07)
Basophils Absolute: 0 10*3/uL (ref 0.0–0.1)
Basophils Relative: 0 %
Eosinophils Absolute: 0.1 10*3/uL (ref 0.0–0.5)
Eosinophils Relative: 1 %
HCT: 38 % (ref 36.0–46.0)
Hemoglobin: 12.6 g/dL (ref 12.0–15.0)
Immature Granulocytes: 0 %
Lymphocytes Relative: 35 %
Lymphs Abs: 2.5 10*3/uL (ref 0.7–4.0)
MCH: 28.9 pg (ref 26.0–34.0)
MCHC: 33.2 g/dL (ref 30.0–36.0)
MCV: 87.2 fL (ref 80.0–100.0)
Monocytes Absolute: 0.5 10*3/uL (ref 0.1–1.0)
Monocytes Relative: 7 %
Neutro Abs: 4.2 10*3/uL (ref 1.7–7.7)
Neutrophils Relative %: 57 %
Platelets: 274 10*3/uL (ref 150–400)
RBC: 4.36 MIL/uL (ref 3.87–5.11)
RDW: 15.5 % (ref 11.5–15.5)
WBC: 7.4 10*3/uL (ref 4.0–10.5)
nRBC: 0 % (ref 0.0–0.2)

## 2020-07-10 LAB — COMPREHENSIVE METABOLIC PANEL
ALT: 14 U/L (ref 0–44)
AST: 21 U/L (ref 15–41)
Albumin: 3.8 g/dL (ref 3.5–5.0)
Alkaline Phosphatase: 79 U/L (ref 38–126)
Anion gap: 8 (ref 5–15)
BUN: 15 mg/dL (ref 8–23)
CO2: 24 mmol/L (ref 22–32)
Calcium: 10.4 mg/dL — ABNORMAL HIGH (ref 8.9–10.3)
Chloride: 108 mmol/L (ref 98–111)
Creatinine, Ser: 1.08 mg/dL — ABNORMAL HIGH (ref 0.44–1.00)
GFR, Estimated: 51 mL/min — ABNORMAL LOW (ref >60)
Glucose, Bld: 109 mg/dL — ABNORMAL HIGH (ref 70–99)
Potassium: 3.7 mmol/L (ref 3.5–5.1)
Sodium: 140 mmol/L (ref 135–145)
Total Bilirubin: 0.8 mg/dL (ref 0.3–1.2)
Total Protein: 7.4 g/dL (ref 6.5–8.1)

## 2020-07-10 LAB — LACTIC ACID, PLASMA: Lactic Acid, Venous: 2.3 mmol/L (ref 0.5–1.9)

## 2020-07-10 LAB — TROPONIN I (HIGH SENSITIVITY): Troponin I (High Sensitivity): 12 ng/L (ref <18)

## 2020-07-10 MED ORDER — AMLODIPINE BESYLATE 10 MG PO TABS
10.0000 mg | ORAL_TABLET | Freq: Every day | ORAL | Status: DC
  Administered 2020-07-11 – 2020-07-13 (×3): 10 mg via ORAL
  Filled 2020-07-10: qty 1
  Filled 2020-07-10 (×2): qty 2

## 2020-07-10 MED ORDER — SODIUM CHLORIDE 0.9 % IV SOLN: INTRAVENOUS | Status: AC

## 2020-07-10 MED ORDER — HYDROCHLOROTHIAZIDE 25 MG PO TABS
12.5000 mg | ORAL_TABLET | Freq: Every day | ORAL | Status: DC
  Administered 2020-07-12 – 2020-07-13 (×2): 12.5 mg via ORAL
  Filled 2020-07-10 (×3): qty 1

## 2020-07-10 MED ORDER — ACETAMINOPHEN 325 MG PO TABS: 650.0000 mg | ORAL_TABLET | Freq: Four times a day (QID) | ORAL | Status: DC | PRN

## 2020-07-10 MED ORDER — ACETAMINOPHEN 650 MG RE SUPP: 650.0000 mg | Freq: Four times a day (QID) | RECTAL | Status: DC | PRN

## 2020-07-10 MED ORDER — MIRTAZAPINE 15 MG PO TABS
7.5000 mg | ORAL_TABLET | Freq: Every day | ORAL | Status: DC
  Administered 2020-07-10 – 2020-07-12 (×3): 7.5 mg via ORAL
  Filled 2020-07-10 (×3): qty 1

## 2020-07-10 MED ORDER — ATORVASTATIN CALCIUM 20 MG PO TABS
80.0000 mg | ORAL_TABLET | Freq: Every day | ORAL | Status: DC
  Administered 2020-07-11 – 2020-07-13 (×3): 80 mg via ORAL
  Filled 2020-07-10: qty 4
  Filled 2020-07-10: qty 1
  Filled 2020-07-10: qty 4

## 2020-07-10 MED ORDER — SODIUM CHLORIDE 0.9 % IV BOLUS
500.0000 mL | Freq: Once | INTRAVENOUS | Status: AC
  Administered 2020-07-10: 500 mL via INTRAVENOUS

## 2020-07-10 MED ORDER — LEVOTHYROXINE SODIUM 50 MCG PO TABS: 75.0000 ug | ORAL_TABLET | Freq: Every day | ORAL | Status: DC

## 2020-07-10 MED ORDER — HEPARIN SODIUM (PORCINE) 5000 UNIT/ML IJ SOLN
5000.0000 [IU] | Freq: Three times a day (TID) | INTRAMUSCULAR | Status: DC
  Administered 2020-07-10 – 2020-07-13 (×9): 5000 [IU] via SUBCUTANEOUS
  Filled 2020-07-10 (×9): qty 1

## 2020-07-10 NOTE — ED Notes (Signed)
Dr. Marisa Severin at bedside to attempt ultrasound guided IV insertion.

## 2020-07-10 NOTE — ED Notes (Signed)
Hospitalist at bedside. Family updated.

## 2020-07-10 NOTE — H&P (Signed)
History and Physical    Sara Bailey ZOX:[id number] DOB: 29-Nov-1940 DOA: 07/10/2020  PCP: Orvis Brill, [facility]    Patient coming from:  [facility]   Chief Complaint:  AMS   HPI: Sara Bailey is a 80 y.o. female seen in ed with complaints of her mental status.  HPI per chart review, family member reports that patient was seeing deceased family members and has been confused.  Patient also reports right leg swelling more than left leg.  Confusion has been going on for the past several days and patient is alert awake cooperative and oriented to self only. Since Tuesday pt is confused and did not know where the bathroom was, snf called family member about 23 at lunch time. Pt was asking about deceased family members . Pt has not known to be confused and progression as week went on.PCP saw her on Tuesday and ordered u/a and that was done just yesterday. Pt was worse by yesterday and today they brought her here. Her legs were swollen as well.Pt is essentially bedridden due to left sided weakness from her stroke. Pt has refused the pacemaker in 2019.  Pt has past medical history of acute kidney injury, failure to thrive, anemia and chronic kidney disease, bradycardia/sick sinus syndrome, history of CVA, history of tobacco abuse, hypercalcemia, hyperparathyroidism, prediabetes, depression, CAD, dementia with behavioral disturbance.  ED Course:  Vitals:   07/10/20 1830 07/10/20 1930 07/10/20 2000 07/10/20 2030  BP: (!) 169/77 (!) 170/65 (!) 179/71 (!) 167/69  Pulse: (!) 55 (!) 56 (!) 58 (!) 57  Resp: 18     Temp:      SpO2: 100% 100% 99% 97%  Weight:      Height:      In the emergency room patient is alert awake oriented hypertensive, afebrile, oxygenating within normal limits at 100% on room air.  CMP shows mild abnormality in creatinine with 1.08 with the EGFR 51, hypercalcemia of 10.4 otherwise normal labs, troponin is normal at 12, BNP of 58.5, lactic of 2.3, CBC is  within normal limits.  Blood and urine cultures have been collected in the emergency room.  UA looks benign, 0-5 WBCs, bacteria, negative nitrite.  Noncontrast head CT negative for any acute findings.  Chest x-ray shows stable cardiomegaly and no acute findings. Lower extremity venous Doppler negative for any blood clots.   Review of Systems:  Review of Systems  Unable to perform ROS: Dementia    Past Medical History:  Diagnosis Date   Coronary artery disease    Hypertension    Stroke [facility])     History reviewed. No pertinent surgical history.   reports that she quit smoking about 3 years ago. Her smoking use included cigarettes. She has never used smokeless tobacco. She reports that she does not drink alcohol and does not use drugs.  No Known Allergies  Family History  Problem Relation Age of Onset   Stroke Father    Heart attack Brother     Prior to Admission medications   Medication Sig Start Date End Date Taking? Authorizing Provider  amLODipine (NORVASC) 10 MG tablet Take 1 tablet (10 mg total) by mouth daily for 30 days. 06/20/18 07/20/18  Saundra Shelling, MD  atorvastatin (LIPITOR) 80 MG tablet Take 1 tablet (80 mg total) by mouth daily for 30 days. 06/20/18 07/20/18  Saundra Shelling, MD  hydrochlorothiazide (HYDRODIURIL) 25 MG tablet Take 1 tablet (25 mg total) by mouth daily for 30 days. 06/21/18 07/21/18  Pyreddy, Reatha Harps,  MD  levothyroxine (SYNTHROID) 75 MCG tablet Take 1 tablet (75 mcg total) by mouth daily for 30 days. 06/20/18 07/20/18  Saundra Shelling, MD  mirtazapine (REMERON) 7.5 MG tablet Take 1 tablet (7.5 mg total) by mouth at bedtime for 30 days. 06/20/18 07/20/18  Saundra Shelling, MD  potassium chloride (KLOR-CON 10) 10 MEQ tablet Take 1 tablet (10 mEq total) by mouth daily for 30 days. 06/20/18 07/20/18  Saundra Shelling, MD    Physical Exam: Vitals:   07/10/20 1830 07/10/20 1930 07/10/20 2000 07/10/20 2030  BP: (!) 169/77 (!) 170/65 (!) 179/71 (!) 167/69  Pulse: (!) 55  (!) 56 (!) 58 (!) 57  Resp: 18     Temp:      SpO2: 100% 100% 99% 97%  Weight:      Height:       Physical Exam Vitals and nursing note reviewed.  Constitutional:      General: She is not in acute distress.    Appearance: She is obese. She is not ill-appearing.  HENT:     Head: Normocephalic and atraumatic.     Right Ear: External ear normal.     Left Ear: External ear normal.     Nose: Nose normal.     Mouth/Throat:     Mouth: Mucous membranes are moist.  Eyes:     Extraocular Movements: Extraocular movements intact.     Pupils: Pupils are equal, round, and reactive to light.  Neck:     Vascular: No carotid bruit.  Cardiovascular:     Rate and Rhythm: Normal rate and regular rhythm.     Pulses: Normal pulses.     Heart sounds: Normal heart sounds.  Pulmonary:     Effort: Pulmonary effort is normal.     Breath sounds: Normal breath sounds.  Abdominal:     General: Bowel sounds are normal. There is no distension.     Palpations: Abdomen is soft.     Tenderness: There is no abdominal tenderness. There is no guarding.  Musculoskeletal:     Right lower leg: Edema present.     Left lower leg: Edema present.  Skin:    General: Skin is warm.  Neurological:     General: No focal deficit present.     Mental Status: She is alert. She is disoriented.     Cranial Nerves: Cranial nerves are intact. No dysarthria or facial asymmetry.     Motor: Weakness present.     Comments: Left UE weak , but pt is able to move all four extremities.    Psychiatric:        Mood and Affect: Mood normal.        Behavior: Behavior normal.     Labs on Admission: I have personally reviewed following labs and imaging studies  No results for input(s): CKTOTAL, CKMB, TROPONINI in the last 72 hours. Lab Results  Component Value Date   WBC 7.4 07/10/2020   HGB 12.6 07/10/2020   HCT 38.0 07/10/2020   MCV 87.2 07/10/2020   PLT 274 07/10/2020    Recent Labs  Lab 07/10/20 1758  NA 140  K 3.7   CL 108  CO2 24  BUN 15  CREATININE 1.08*  CALCIUM 10.4*  PROT 7.4  BILITOT 0.8  ALKPHOS 79  ALT 14  AST 21  GLUCOSE 109*   No results found for: CHOL, HDL, LDLCALC, TRIG No results found for: DDIMER Invalid input(s): POCBNP  Urinalysis    Component Value Date/Time  COLORURINE STRAW (A) 07/10/2020 1811   APPEARANCEUR CLEAR (A) 07/10/2020 1811   LABSPEC 1.006 07/10/2020 1811   PHURINE 6.0 07/10/2020 1811   GLUCOSEU NEGATIVE 07/10/2020 1811   HGBUR NEGATIVE 07/10/2020 1811   BILIRUBINUR NEGATIVE 07/10/2020 1811   KETONESUR NEGATIVE 07/10/2020 1811   PROTEINUR NEGATIVE 07/10/2020 1811   NITRITE NEGATIVE 07/10/2020 1811   LEUKOCYTESUR NEGATIVE 07/10/2020 1811   COVID-19 Labs No results for input(s): DDIMER, FERRITIN, LDH, CRP in the last 72 hours. Lab Results  Component Value Date   SARSCOV2NAA NEGATIVE 06/20/2018   Pierre NEGATIVE 06/17/2018    Radiological Exams on Admission: CT Head Wo Contrast  Result Date: 07/10/2020 CLINICAL DATA:  Altered mental status. EXAM: CT HEAD WITHOUT CONTRAST TECHNIQUE: Contiguous axial images were obtained from the base of the skull through the vertex without intravenous contrast. COMPARISON:  January 17, 2019. FINDINGS: Brain: Mild chronic ischemic white matter disease is noted. No mass effect or midline shift is noted. Ventricular size is within normal limits. There is no evidence of mass lesion, hemorrhage or acute infarction. Vascular: No hyperdense vessel or unexpected calcification. Skull: Normal. Negative for fracture or focal lesion. Sinuses/Orbits: No acute finding. Other: None. IMPRESSION: No acute intracranial abnormality seen. Electronically Signed   By: Marijo Conception M.D.   On: 07/10/2020 16:01   US Venous Img Lower Unilateral Right  Result Date: 07/10/2020 CLINICAL DATA:  Right lower extremity edema.  Evaluate for DVT. EXAM: RIGHT LOWER EXTREMITY VENOUS DOPPLER ULTRASOUND TECHNIQUE: Gray-scale sonography with graded  compression, as well as color Doppler and duplex ultrasound were performed to evaluate the lower extremity deep venous systems from the level of the common femoral vein and including the common femoral, femoral, profunda femoral, popliteal and calf veins including the posterior tibial, peroneal and gastrocnemius veins when visible. The superficial great saphenous vein was also interrogated. Spectral Doppler was utilized to evaluate flow at rest and with distal augmentation maneuvers in the common femoral, femoral and popliteal veins. COMPARISON:  None. FINDINGS: Contralateral Common Femoral Vein: Respiratory phasicity is normal and symmetric with the symptomatic side. No evidence of thrombus. Normal compressibility. Common Femoral Vein: No evidence of thrombus. Normal compressibility, respiratory phasicity and response to augmentation. Saphenofemoral Junction: No evidence of thrombus. Normal compressibility and flow on color Doppler imaging. Profunda Femoral Vein: No evidence of thrombus. Normal compressibility and flow on color Doppler imaging. Femoral Vein: No evidence of thrombus. Normal compressibility, respiratory phasicity and response to augmentation. Popliteal Vein: No evidence of thrombus. Normal compressibility, respiratory phasicity and response to augmentation. Calf Veins: Appear patent where visualized. Superficial Great Saphenous Vein: No evidence of thrombus. Normal compressibility. Venous Reflux:  None. Other Findings:  None. IMPRESSION: No evidence of DVT within the right lower extremity. Electronically Signed   By: Sandi Mariscal M.D.   On: 07/10/2020 16:44   DG Chest Portable 1 View  Result Date: 07/10/2020 CLINICAL DATA:  Altered mental status EXAM: PORTABLE CHEST 1 VIEW COMPARISON:  06/17/18 FINDINGS: Cardiac shadow remains enlarged. Aortic calcifications are again seen. The lungs are well aerated bilaterally without focal infiltrate. No acute bony abnormality is seen. IMPRESSION: Stable  cardiomegaly without acute abnormality. Electronically Signed   By: Inez Catalina M.D.   On: 07/10/2020 16:26    EKG: Independently reviewed.  Sinus bradycardia at 56, short PR interval, large bundle branch block, left ventricular hypertrophy, prolonged QTC at 516.     Assessment/Plan Principal Problem:   Altered mental status Active Problems:   Dementia with behavioral disturbance (  [place])   Coronary artery disease   Essential hypertension   Glaucoma   Hypothyroidism   Prediabetes   Chronic kidney disease (CKD) stage G3a/A1, moderately decreased glomerular filtration rate (GFR) between 45-59 mL/min/1.73 square meter and albuminuria creatinine ratio less than 30 mg/g (HCC)   Altered mental status/dementia with behavioral disturbance:  Will admit patient in inpatient status with MedSurg and cardiac monitoring. Differentials include TIA, CVA, dementia worsening. Patient's initial head CT is negative will obtain MRI noncontrast, 2D echo and carotid Dopplers. Neurochecks, fall and aspiration precautions.  CAD: We will continue patient on home regimen of atorvastatin 80, aspirin 81 for antiplatelet therapy once med reconciliation is completed.  Hypertension: Blood pressure (!) 167/69, pulse (!) 57, temperature 98.1 F (36.7 C), resp. rate 18, height _0  (1.702 m), weight 58 kg, SpO2 97 %. Will resume home medication regimen with HCTZ 25, amlodipine 10 mg.    Glaucoma: No eyedrops on chart review, resume once med reconciliation is available.    Hypothyroidism: We will continue patient on her home regimen of 75 mcg of levothyroxine and check free T4 and TSH.     Prediabetes: Check patient's A1c.    CKD: Lab Results  Component Value Date   CREATININE 1.08 (H) 07/10/2020   CREATININE 1.19 (H) 06/21/2018   CREATININE 0.87 06/18/2018  No medication changes, will try to limit any contrast studies unless absolutely necessary and renally dose all needed medications.    DVT  prophylaxis:  Heparin  Code Status:  Full code  Family Communication:  Rogers,Ben (Brother)  [phone] (Mobile)   Disposition Plan:  [facility]  Consults called:  None  Admission status: Inpatient   Para Skeans MD [facility] [phone] How to contact the [facility] Attending or Consulting provider [place] or covering provider during after hours [place], for this patient.    Check the care team in [facility] and look for a) attending/consulting [facility] provider listed and b) the [facility] team listed Log into [email or web address] and use [facility]'s universal password to access. If you do not have the password, please contact the hospital operator. Locate the [facility] provider you are looking for under [facility] and page to a number that you can be directly reached. If you still have difficulty reaching the provider, please page the [facility] (Director on Call) for the Hospitalists listed on amion for assistance. [email or web address] Password [email or web address] 07/10/2020, 9:22 PM

## 2020-07-10 NOTE — ED Triage Notes (Signed)
Pt to ER via EMS from [place] with reports from family of increasing confusion over last several days.  Pt arrives alert and oriented to self only.  Pt able to answer some questions appropriately, others she is unable to answer.  Pt denies pain, NAD noted.

## 2020-07-10 NOTE — ED Notes (Addendum)
Dr. Marisa Severin notified patient ultrasound guided IV that was inserted by IV team is infiltrated. Patient right upper extremity edematous, complaining of pain. Attempted to insert IV x2 by this RN, no success. Unable to obtain labs at this time.

## 2020-07-10 NOTE — ED Notes (Signed)
MD unable to obtain IV access. Patient refusing second attempt for IV by provider.

## 2020-07-10 NOTE — ED Provider Notes (Signed)
[facility] Emergency Department Provider Note  ____________________________________________   Event Date/Time   First MD Initiated Contact with Patient 07/10/20 1518     (approximate)  I have reviewed the triage vital signs and the nursing notes.   HISTORY  Chief Complaint Altered Mental Status    HPI Sara Bailey is a 80 y.o. female Presents emergency department with altered mental status.  Patient's family member states that she has been more confused and is talking about deceased family members.  States she told him that she spent the night with family members.  Patient is at [facility] facility.  She did have a stroke several years ago and has not walked in 2 years.  She has been doing physical therapy to try large, walk again.  Family member also states that the regular physician said the right lower leg was much more swollen than the left and would like to get a ultrasound which has not been done at this time.   Past Medical History:  Diagnosis Date   Coronary artery disease    Hypertension    Stroke [facility])     Patient Active Problem List   Diagnosis Date Noted   Altered mental status 06/17/2018    History reviewed. No pertinent surgical history.  Prior to Admission medications   Medication Sig Start Date End Date Taking? Authorizing Provider  amLODipine (NORVASC) 10 MG tablet Take 1 tablet (10 mg total) by mouth daily for 30 days. 06/20/18 07/20/18  Ihor Austin, MD  atorvastatin (LIPITOR) 80 MG tablet Take 1 tablet (80 mg total) by mouth daily for 30 days. 06/20/18 07/20/18  Ihor Austin, MD  hydrochlorothiazide (HYDRODIURIL) 25 MG tablet Take 1 tablet (25 mg total) by mouth daily for 30 days. 06/21/18 07/21/18  Ihor Austin, MD  levothyroxine (SYNTHROID) 75 MCG tablet Take 1 tablet (75 mcg total) by mouth daily for 30 days. 06/20/18 07/20/18  Ihor Austin, MD  mirtazapine (REMERON) 7.5 MG tablet Take 1 tablet (7.5 mg total) by mouth at  bedtime for 30 days. 06/20/18 07/20/18  Ihor Austin, MD  potassium chloride (KLOR-CON 10) 10 MEQ tablet Take 1 tablet (10 mEq total) by mouth daily for 30 days. 06/20/18 07/20/18  Ihor Austin, MD    Allergies Patient has no known allergies.  History reviewed. No pertinent family history.  Social History Social History   Tobacco Use   Smoking status: Former    Pack years: 0.00   Smokeless tobacco: Never  Vaping Use   Vaping Use: Never used  Substance Use Topics   Alcohol use: Never   Drug use: Never    Review of Systems Is poor historian, cannot obtain review of systems  Constitutional: No fever/chills Eyes: No visual changes. ENT: No sore throat. Respiratory: Denies cough Cardiovascular: Denies chest pain Gastrointestinal: Denies abdominal pain Genitourinary: Negative for dysuria. Musculoskeletal: Negative for back pain. Skin: Negative for rash. Psychiatric: no mood changes,     ____________________________________________   PHYSICAL EXAM:  VITAL SIGNS: ED Triage Vitals  Enc Vitals Group     BP 07/10/20 1510 (!) 166/76     Pulse Rate 07/10/20 1510 (!) 46     Resp 07/10/20 1510 16     Temp 07/10/20 1510 98.1 F (36.7 C)     Temp src --      SpO2 07/10/20 1510 100 %     Weight 07/10/20 1511 127 lb 13.9 oz (58 kg)     Height 07/10/20 1511 5\' 7"  (1.702 m)  Head Circumference --      Peak Flow --      Pain Score --      Pain Loc --      Pain Edu? --      Excl. in GC? --     Constitutional: Alert, confused to time and place, well appearing and in no acute distress. Eyes: Conjunctivae are normal.  PERRL Head: Atraumatic. Nose: No congestion/rhinnorhea. Mouth/Throat: Mucous membranes are moist.   Neck:  supple no lymphadenopathy noted Cardiovascular: Normal rate, regular rhythm. Heart sounds are normal Respiratory: Normal respiratory effort.  No retractions, lungs c t a  Abd: soft nontender bs normal all 4 quad GU: deferred Musculoskeletal: FROM  all extremities, warm and well perfused, right lower leg is tender to palpation, mild edema noted Neurologic:  Normal speech and language.  Skin:  Skin is warm, dry and intact. No rash noted. Psychiatric: Mood and affect are normal. Speech and behavior are normal   ____________________________________________   LABS (all labs ordered are listed, but only abnormal results are displayed)  Labs Reviewed  COMPREHENSIVE METABOLIC PANEL  BRAIN NATRIURETIC PEPTIDE  LACTIC ACID, PLASMA  LACTIC ACID, PLASMA  URINALYSIS, COMPLETE (UACMP) WITH MICROSCOPIC  CBC WITH DIFFERENTIAL/PLATELET  TROPONIN I (HIGH SENSITIVITY)  TROPONIN I (HIGH SENSITIVITY)   ____________________________________________   ____________________________________________  RADIOLOGY  CT of the head, chest x-ray, ultrasound right lower extremity  ____________________________________________   PROCEDURES  Procedure(s) performed: No  Procedures    ____________________________________________   INITIAL IMPRESSION / ASSESSMENT AND PLAN / ED COURSE  Pertinent labs & imaging results that were available during my care of the patient were reviewed by me and considered in my medical decision making (see chart for details).   Patient is an 80 year old female presents with altered mental status.  See HPI.  Physical exam shows patient appears stable  DDx: Sepsis, CVA, MI, covid, UTI, DVT  Labs and imaging ordered  CT of the head reviewed by me confirmed by radiology to be negative  Care transferred to Dr. Criss Alvine was evaluated in Emergency Department on 07/10/2020 for the symptoms described in the history of present illness. She was evaluated in the context of the global COVID-19 pandemic, which necessitated consideration that the patient might be at risk for infection with the SARS-CoV-2 virus that causes COVID-19. Institutional protocols and algorithms that pertain to the evaluation of patients at  risk for COVID-19 are in a state of rapid change based on information released by regulatory bodies including the CDC and federal and state organizations. These policies and algorithms were followed during the patient's care in the ED.    As part of my medical decision making, I reviewed the following data within the electronic MEDICAL RECORD NUMBER History obtained from family, Nursing notes reviewed and incorporated, Old chart reviewed, Patient signed out to Dr. Marisa Severin, Radiograph reviewed , Evaluated by EM attending , Notes from prior ED visits, and [place] Controlled Substance Database  ____________________________________________   FINAL CLINICAL IMPRESSION(S) / ED DIAGNOSES  Final diagnoses:  Altered mental status, unspecified altered mental status type      NEW MEDICATIONS STARTED DURING THIS VISIT:  New Prescriptions   No medications on file     Note:  This document was prepared using Dragon voice recognition software and may include unintentional dictation errors.    Faythe Ghee, PA-C 07/10/20 1614    Dionne Bucy, MD 07/11/20 [phone]

## 2020-07-10 NOTE — ED Notes (Addendum)
Talbert Forest (sister-in-law) can be reached at [phone].

## 2020-07-11 ENCOUNTER — Observation Stay: Payer: Medicare PPO

## 2020-07-11 DIAGNOSIS — N1831 Chronic kidney disease, stage 3a: Secondary | ICD-10-CM | POA: Diagnosis not present

## 2020-07-11 DIAGNOSIS — R4182 Altered mental status, unspecified: Secondary | ICD-10-CM | POA: Diagnosis not present

## 2020-07-11 DIAGNOSIS — I2510 Atherosclerotic heart disease of native coronary artery without angina pectoris: Secondary | ICD-10-CM | POA: Diagnosis not present

## 2020-07-11 DIAGNOSIS — I10 Essential (primary) hypertension: Secondary | ICD-10-CM | POA: Diagnosis not present

## 2020-07-11 LAB — VITAMIN B12: Vitamin B-12: 230 pg/mL (ref 180–914)

## 2020-07-11 LAB — CBC
HCT: 39 % (ref 36.0–46.0)
Hemoglobin: 12.9 g/dL (ref 12.0–15.0)
MCH: 28.5 pg (ref 26.0–34.0)
MCHC: 33.1 g/dL (ref 30.0–36.0)
MCV: 86.3 fL (ref 80.0–100.0)
Platelets: 214 10*3/uL (ref 150–400)
RBC: 4.52 MIL/uL (ref 3.87–5.11)
RDW: 16.1 % — ABNORMAL HIGH (ref 11.5–15.5)
WBC: 7.8 10*3/uL (ref 4.0–10.5)
nRBC: 0 % (ref 0.0–0.2)

## 2020-07-11 LAB — COMPREHENSIVE METABOLIC PANEL
ALT: 13 U/L (ref 0–44)
AST: 24 U/L (ref 15–41)
Albumin: 3.9 g/dL (ref 3.5–5.0)
Alkaline Phosphatase: 96 U/L (ref 38–126)
Anion gap: 9 (ref 5–15)
BUN: 12 mg/dL (ref 8–23)
CO2: 24 mmol/L (ref 22–32)
Calcium: 10.3 mg/dL (ref 8.9–10.3)
Chloride: 105 mmol/L (ref 98–111)
Creatinine, Ser: 1.08 mg/dL — ABNORMAL HIGH (ref 0.44–1.00)
GFR, Estimated: 51 mL/min — ABNORMAL LOW (ref >60)
Glucose, Bld: 154 mg/dL — ABNORMAL HIGH (ref 70–99)
Potassium: 3.8 mmol/L (ref 3.5–5.1)
Sodium: 138 mmol/L (ref 135–145)
Total Bilirubin: 0.9 mg/dL (ref 0.3–1.2)
Total Protein: 8 g/dL (ref 6.5–8.1)

## 2020-07-11 LAB — LACTIC ACID, PLASMA: Lactic Acid, Venous: 2.1 mmol/L (ref 0.5–1.9)

## 2020-07-11 LAB — TSH: TSH: 7.256 u[IU]/mL — ABNORMAL HIGH (ref 0.350–4.500)

## 2020-07-11 LAB — TROPONIN I (HIGH SENSITIVITY): Troponin I (High Sensitivity): 24 ng/L — ABNORMAL HIGH (ref <18)

## 2020-07-11 LAB — T4, FREE: Free T4: 1.18 ng/dL — ABNORMAL HIGH (ref 0.61–1.12)

## 2020-07-11 LAB — SEDIMENTATION RATE: Sed Rate: 3 mm/hr (ref 0–30)

## 2020-07-11 MED ORDER — HYDRALAZINE HCL 25 MG PO TABS
25.0000 mg | ORAL_TABLET | Freq: Three times a day (TID) | ORAL | Status: DC
  Administered 2020-07-11 – 2020-07-13 (×6): 25 mg via ORAL
  Filled 2020-07-11 (×11): qty 1

## 2020-07-11 MED ORDER — LORAZEPAM 2 MG/ML IJ SOLN
1.0000 mg | Freq: Once | INTRAMUSCULAR | Status: AC
  Administered 2020-07-11: 1 mg via INTRAVENOUS
  Filled 2020-07-11: qty 1

## 2020-07-11 MED ORDER — ASPIRIN EC 325 MG PO TBEC
325.0000 mg | DELAYED_RELEASE_TABLET | Freq: Every day | ORAL | Status: DC
  Administered 2020-07-11 – 2020-07-13 (×3): 325 mg via ORAL
  Filled 2020-07-11 (×3): qty 1

## 2020-07-11 MED ORDER — LEVOTHYROXINE SODIUM 50 MCG PO TABS
75.0000 ug | ORAL_TABLET | Freq: Every day | ORAL | Status: DC
  Administered 2020-07-11 – 2020-07-13 (×3): 75 ug via ORAL
  Filled 2020-07-11: qty 1
  Filled 2020-07-11: qty 2
  Filled 2020-07-11: qty 1

## 2020-07-11 NOTE — ED Notes (Signed)
Lab informed this RN they were unable to get blood work at this time. They attempted 3 times with no success.

## 2020-07-11 NOTE — ED Notes (Addendum)
Unsuccessful IV insertion x2 by this RN. MD aware.

## 2020-07-11 NOTE — ED Notes (Signed)
This RN at bedside at this time. Pt is alert and oriented to self only. Pt is now cooperative and following commands. Lab at bedside to attempt to obtain blood work.

## 2020-07-11 NOTE — ED Notes (Signed)
Dr. Para March notified patient uncooperative, unable to follow commands. Unable to obtain IV access on this patient by multiple ER staff, including this RN, IV team, and ER doctor. Morning labs will be drawn with IV insertion after shift change.
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
## 2020-07-11 NOTE — ED Notes (Signed)
Patient taken to MRI

## 2020-07-11 NOTE — Progress Notes (Signed)
1       [place] at [facility]   PATIENT NAME: Sara Bailey    MR#:  [id number]  DATE OF BIRTH:  [birth year]  SUBJECTIVE:  CHIEF COMPLAINT:   Chief Complaint  Patient presents with   Altered Mental Status  Patient remains very confused, agitated and refusing all her labs and further work-up.  She is requesting to leave REVIEW OF SYSTEMS:  ROS unable to obtain due to her mental status DRUG ALLERGIES:  No Known Allergies VITALS:  Blood pressure (!) 188/81, pulse 62, temperature 98.1 F (36.7 C), resp. rate 20, height 5\' 7"  (1.702 m), weight 58 kg, SpO2 96 %. PHYSICAL EXAMINATION:  Physical Exam 80 year old female lying in the bed, agitated and wanting to leave Lungs clear to auscultation bilaterally no wheezing rales rhonchi crepitation Cardiovascular S1-S2 normal, no murmur rubs or gallop Abdomen soft, benign Neuro awake but disoriented, nonfocal exam Skin no rash or lesion Psych: Agitated LABORATORY PANEL:  Female CBC Recent Labs  Lab 07/11/20 1239  WBC 7.8  HGB 12.9  HCT 39.0  PLT 214   ------------------------------------------------------------------------------------------------------------------ Chemistries  Recent Labs  Lab 07/11/20 1239  NA 138  K 3.8  CL 105  CO2 24  GLUCOSE 154*  BUN 12  CREATININE 1.08*  CALCIUM 10.3  AST 24  ALT 13  ALKPHOS 96  BILITOT 0.9   RADIOLOGY:  DG Eye Foreign Body  Result Date: 07/11/2020 CLINICAL DATA:  Clearance for MRI EXAM: ORBITS FOR FOREIGN BODY - 2 VIEW COMPARISON:  None. FINDINGS: There is no evidence of metallic foreign body within the orbits. No significant bone abnormality identified. IMPRESSION: No evidence of metallic foreign body within the orbits. Electronically Signed   By: 09/10/2020 M.D.   On: 07/11/2020 00:21   DG Abd 1 View  Result Date: 07/11/2020 CLINICAL DATA:  MR clearance.  Evaluate for for foreign body. EXAM: ABDOMEN - 1 VIEW COMPARISON:  None. FINDINGS: Mild gaseous distension  of the colon. No evidence of bowel obstruction. No organomegaly or free air. Diffuse vascular calcifications. No radiopaque foreign body. IMPRESSION: No radiopaque foreign body. Electronically Signed   By: 09/10/2020 M.D.   On: 07/11/2020 00:21   CT Head Wo Contrast  Result Date: 07/10/2020 CLINICAL DATA:  Altered mental status. EXAM: CT HEAD WITHOUT CONTRAST TECHNIQUE: Contiguous axial images were obtained from the base of the skull through the vertex without intravenous contrast. COMPARISON:  January 17, 2019. FINDINGS: Brain: Mild chronic ischemic white matter disease is noted. No mass effect or midline shift is noted. Ventricular size is within normal limits. There is no evidence of mass lesion, hemorrhage or acute infarction. Vascular: No hyperdense vessel or unexpected calcification. Skull: Normal. Negative for fracture or focal lesion. Sinuses/Orbits: No acute finding. Other: None. IMPRESSION: No acute intracranial abnormality seen. Electronically Signed   By: January 19, 2019 M.D.   On: 07/10/2020 16:01   09/09/2020 Carotid Bilateral  Result Date: 07/11/2020 CLINICAL DATA:  Altered mental status EXAM: BILATERAL CAROTID DUPLEX ULTRASOUND TECHNIQUE: 09/10/2020 scale imaging, color Doppler and duplex ultrasound were performed of bilateral carotid and vertebral arteries in the neck. COMPARISON:  None. FINDINGS: Criteria: Quantification of carotid stenosis is based on velocity parameters that correlate the residual internal carotid diameter with NASCET-based stenosis levels, using the diameter of the distal internal carotid lumen as the denominator for stenosis measurement. The following velocity measurements were obtained: RIGHT ICA: 89/18 cm/sec CCA: 48/10 cm/sec SYSTOLIC ICA/CCA RATIO:  1.8 ECA: 88 cm/sec LEFT  ICA: 103/13 cm/sec CCA: 52/12 cm/sec SYSTOLIC ICA/CCA RATIO:  2.0 ECA: 120 cm/sec RIGHT CAROTID ARTERY: Examination of preliminary grayscale images demonstrate mild intimal thickening in the common carotid  artery as well as mild atherosclerotic plaque in the region of the carotid bulb and proximal internal carotid artery. Waveforms, velocities and flow velocity ratios however demonstrate no evidence of focal hemodynamically significant stenosis. RIGHT VERTEBRAL ARTERY:  Antegrade in nature. LEFT CAROTID ARTERY: Preliminary grayscale images demonstrate mild intimal thickening within the common carotid artery. Atherosclerotic plaque is noted in the region of the carotid bulb and extending into the proximal internal carotid artery. Evaluation of the waveforms, velocities and flow velocity ratios however demonstrate no evidence of focal hemodynamically significant stenosis. LEFT VERTEBRAL ARTERY:  Antegrade in nature. IMPRESSION: Bilateral atherosclerotic plaque without evidence of focal hemodynamically significant stenosis. Electronically Signed   By: Alcide Clever M.D.   On: 07/11/2020 00:00   US Venous Img Lower Unilateral Right  Result Date: 07/10/2020 CLINICAL DATA:  Right lower extremity edema.  Evaluate for DVT. EXAM: RIGHT LOWER EXTREMITY VENOUS DOPPLER ULTRASOUND TECHNIQUE: Gray-scale sonography with graded compression, as well as color Doppler and duplex ultrasound were performed to evaluate the lower extremity deep venous systems from the level of the common femoral vein and including the common femoral, femoral, profunda femoral, popliteal and calf veins including the posterior tibial, peroneal and gastrocnemius veins when visible. The superficial great saphenous vein was also interrogated. Spectral Doppler was utilized to evaluate flow at rest and with distal augmentation maneuvers in the common femoral, femoral and popliteal veins. COMPARISON:  None. FINDINGS: Contralateral Common Femoral Vein: Respiratory phasicity is normal and symmetric with the symptomatic side. No evidence of thrombus. Normal compressibility. Common Femoral Vein: No evidence of thrombus. Normal compressibility, respiratory phasicity  and response to augmentation. Saphenofemoral Junction: No evidence of thrombus. Normal compressibility and flow on color Doppler imaging. Profunda Femoral Vein: No evidence of thrombus. Normal compressibility and flow on color Doppler imaging. Femoral Vein: No evidence of thrombus. Normal compressibility, respiratory phasicity and response to augmentation. Popliteal Vein: No evidence of thrombus. Normal compressibility, respiratory phasicity and response to augmentation. Calf Veins: Appear patent where visualized. Superficial Great Saphenous Vein: No evidence of thrombus. Normal compressibility. Venous Reflux:  None. Other Findings:  None. IMPRESSION: No evidence of DVT within the right lower extremity. Electronically Signed   By: Simonne Come M.D.   On: 07/10/2020 16:44   DG Chest Portable 1 View  Result Date: 07/10/2020 CLINICAL DATA:  Altered mental status EXAM: PORTABLE CHEST 1 VIEW COMPARISON:  06/17/18 FINDINGS: Cardiac shadow remains enlarged. Aortic calcifications are again seen. The lungs are well aerated bilaterally without focal infiltrate. No acute bony abnormality is seen. IMPRESSION: Stable cardiomegaly without acute abnormality. Electronically Signed   By: Alcide Clever M.D.   On: 07/10/2020 16:26   ASSESSMENT AND PLAN:  Patient is a 80 year old female with a known history of hypertension, hyperlipidemia, CVA is admitted for mental status changes  Acute metabolic encephalopathy Patient is a resident at [facility] assisted living per family.  She does have confusion at baseline but has gotten worse since last Tuesday and family was concerned that she may have some infection Her UA is concerning for UTI -continue antibiotics for now, await urine culture - underlying dementia with behavioral disturbances -Patient had been refusing most of her labs, radiological work-up and getting IV line.  Patient is requesting leave the emergency department but is very confused After discussion with family  they have  agreed to talk with her to get treatment and cooperation for same -CT head is negative, carotid Doppler shows no hemodynamically significant stenosis obtain 2D echo and MRI of the brain  -Chest x-ray shows no acute cardiopulmonary disease.  No DVT in the right lower extremity on venous Doppler  CAD: Continue Lipitor, aspirin   Hypertension: Continue HCTZ, blood pressure remains elevated which is likely due to agitation.  Add hydralazine 25 mg p.o. 3 times daily.  Monitor for now   Hypothyroidism: Continue 75 mcg of levothyroxine for now - free T4 and TSH are both elevated.    Sick sinus syndrome/bradycardia History of CVA NSVT Hyperlipidemia  Overall poor prognosis    Body mass index is 20.03 kg/m.  Net IO Since Admission: -1,200 mL [07/11/20 1433]      Status is: Observation  The patient remains OBS appropriate and will d/c before 2 midnights.  Dispo: The patient is from: ALF              Anticipated d/c is to: ALF              Patient currently is not medically stable to d/c.   Difficult to place patient No    DVT prophylaxis:       heparin injection 5,000 Units Start: 07/10/20 2200 SCDs Start: 07/10/20 2045     Family Communication: Discussed with patient's sister and brother over phone on 6/11   All the records are reviewed and case discussed with Care Management/Social Worker. Management plans discussed with the patient, family and they are in agreement.  CODE STATUS: Full Code Level of care: Med-Surg  TOTAL TIME TAKING CARE OF THIS PATIENT: 35 minutes.   More than 50% of the time was spent in counseling/coordination of care: YES  POSSIBLE D/C IN 2-3 DAYS, DEPENDING ON CLINICAL CONDITION.   Delfino Lovett M.D on 07/11/2020 at 2:33 PM  [facility]   CC: Primary care physician; [facility]  Note: This dictation was prepared with Dragon dictation along with smaller phrase technology. Any transcriptional errors that  result from this process are unintentional.

## 2020-07-11 NOTE — ED Notes (Signed)
Patient uncooperative during MRI exam, unable to hold still.

## 2020-07-11 NOTE — ED Notes (Signed)
Patient unable to follow commands, continues to pull off medical equipment. Confused. Placed back on cardiac monitor and pulse oximeter. Bed alarm checked. Will continue to monitor

## 2020-07-11 NOTE — ED Notes (Signed)
Called lab to obtain blood work at this time.  

## 2020-07-11 NOTE — ED Notes (Signed)
Patient repositioned in bed. External urinary catheter replaced. Soiled linen changed. Patient is confused. Unable to follow commands. Placed back on cardiac monitor and pulse oximeter. Reaching out to hit staff when changing bed and gown. Will continue to monitor.

## 2020-07-12 DIAGNOSIS — I2510 Atherosclerotic heart disease of native coronary artery without angina pectoris: Secondary | ICD-10-CM | POA: Diagnosis not present

## 2020-07-12 DIAGNOSIS — R4182 Altered mental status, unspecified: Secondary | ICD-10-CM | POA: Diagnosis not present

## 2020-07-12 DIAGNOSIS — H409 Unspecified glaucoma: Secondary | ICD-10-CM | POA: Diagnosis not present

## 2020-07-12 DIAGNOSIS — N1831 Chronic kidney disease, stage 3a: Secondary | ICD-10-CM | POA: Diagnosis not present

## 2020-07-12 LAB — URINE CULTURE: Culture: <10000 — AB

## 2020-07-12 LAB — CBC
HCT: 37.6 % (ref 36.0–46.0)
Hemoglobin: 12.6 g/dL (ref 12.0–15.0)
MCH: 28.5 pg (ref 26.0–34.0)
MCHC: 33.5 g/dL (ref 30.0–36.0)
MCV: 85.1 fL (ref 80.0–100.0)
Platelets: 310 10*3/uL (ref 150–400)
RBC: 4.42 MIL/uL (ref 3.87–5.11)
RDW: 15.1 % (ref 11.5–15.5)
WBC: 8.1 10*3/uL (ref 4.0–10.5)
nRBC: 0 % (ref 0.0–0.2)

## 2020-07-12 LAB — COMPREHENSIVE METABOLIC PANEL
ALT: 13 U/L (ref 0–44)
AST: 24 U/L (ref 15–41)
Albumin: 3.9 g/dL (ref 3.5–5.0)
Alkaline Phosphatase: 91 U/L (ref 38–126)
Anion gap: 10 (ref 5–15)
BUN: 14 mg/dL (ref 8–23)
CO2: 24 mmol/L (ref 22–32)
Calcium: 10.4 mg/dL — ABNORMAL HIGH (ref 8.9–10.3)
Chloride: 107 mmol/L (ref 98–111)
Creatinine, Ser: 1.17 mg/dL — ABNORMAL HIGH (ref 0.44–1.00)
GFR, Estimated: 47 mL/min — ABNORMAL LOW (ref >60)
Glucose, Bld: 115 mg/dL — ABNORMAL HIGH (ref 70–99)
Potassium: 4.2 mmol/L (ref 3.5–5.1)
Sodium: 141 mmol/L (ref 135–145)
Total Bilirubin: 1.1 mg/dL (ref 0.3–1.2)
Total Protein: 7.5 g/dL (ref 6.5–8.1)

## 2020-07-12 MED ORDER — LACTATED RINGERS IV SOLN: INTRAVENOUS | Status: DC

## 2020-07-12 NOTE — Progress Notes (Signed)
1       [place] at [facility]   PATIENT NAME: Sara Bailey    MR#:  [id number]  DATE OF BIRTH:  1940/07/29  SUBJECTIVE:  CHIEF COMPLAINT:   Chief Complaint  Patient presents with   Altered Mental Status  Patient remains very confused, agitated and refusing all her labs and further work-up.  She is requesting to leave REVIEW OF SYSTEMS:  ROS unable to obtain due to her mental status DRUG ALLERGIES:  No Known Allergies VITALS:  Blood pressure (!) 144/71, pulse 72, temperature 99.2 F (37.3 C), temperature source Axillary, resp. rate 18, height 5\' 7"  (1.702 m), weight 58 kg, SpO2 93 %. PHYSICAL EXAMINATION:  Physical Exam 80 year old female lying in the bed in no acute distress Lungs clear to auscultation bilaterally no wheezing rales rhonchi crepitation Cardiovascular S1-S2 normal, no murmur rubs or gallop Abdomen soft, benign Neuro awake but disoriented, and confused nonfocal exam Skin no rash or lesion Psych: Normal mood LABORATORY PANEL:  Female CBC Recent Labs  Lab 07/11/20 1239  WBC 7.8  HGB 12.9  HCT 39.0  PLT 214    ------------------------------------------------------------------------------------------------------------------ Chemistries  Recent Labs  Lab 07/12/20 0332  NA 141  K 4.2  CL 107  CO2 24  GLUCOSE 115*  BUN 14  CREATININE 1.17*  CALCIUM 10.4*  AST 24  ALT 13  ALKPHOS 91  BILITOT 1.1    RADIOLOGY:  MR BRAIN WO CONTRAST  Result Date: 07/11/2020 CLINICAL DATA:  Initial evaluation for acute altered mental status. EXAM: MRI HEAD WITHOUT CONTRAST TECHNIQUE: Multiplanar, multiecho pulse sequences of the brain and surrounding structures were obtained without intravenous contrast. COMPARISON:  Prior head CT from 07/10/2020. FINDINGS: Brain: Examination moderately to severely degraded by motion artifact. Generalized age-related cerebral volume loss. Patchy T2/FLAIR hyperintensity within the periventricular and deep white matter both  cerebral hemispheres most likely related chronic microvascular ischemic disease. Patchy involvement of the pons noted. Remote lacunar infarcts present at the left thalamus and pons. No definite foci of restricted diffusion to suggest acute or subacute ischemia. Gray-white matter differentiation maintained. No visible areas of chronic cortical infarction. No definite evidence for acute or chronic intracranial hemorrhage. No mass lesion, midline shift or mass effect. Mild ventricular prominence related to global parenchymal volume loss without hydrocephalus. No extra-axial fluid collection. Vascular: Major intracranial vascular flow voids are grossly maintained. Skull and upper cervical spine: Craniocervical junction grossly within normal limits. Bone marrow signal intensity grossly normal. No visible scalp soft tissue abnormality. Sinuses/Orbits: Globes and orbital soft tissues within normal limits. Mild mucosal thickening noted within the left maxillary sinus. Paranasal sinuses are otherwise clear. No significant mastoid effusion. Other: None. IMPRESSION: 1. Technically limited exam due to extensive motion artifact. 2. No acute intracranial abnormality identified. 3. Age-related cerebral volume loss with chronic microvascular ischemic disease, with remote lacunar infarcts at the left thalamus and pons. Electronically Signed   By: 09/09/2020 M.D.   On: 07/11/2020 20:32   ASSESSMENT AND PLAN:  Patient is a 80 year old female with a known history of hypertension, hyperlipidemia, CVA is admitted for mental status changes  Acute metabolic encephalopathy Likely multifactorial -advancing age, progressive dementia, lack of sleep, mild UTI, dehydration and may be missing some of her medication including thyroid as her TSH was high on admission Patient is a resident at [facility] assisted [facility] per family.  She does have confusion at baseline but has gotten worse since last Tuesday and family was concerned  that she may have  some infection Her UA is concerning for mild UTI -continue antibiotics for now, urine culture shows less than 10,000 colonies - underlying dementia with behavioral disturbances -CT head is negative, carotid Doppler shows no hemodynamically significant stenosis, MRI of the brain negative for acute stroke -Chest x-ray shows no acute cardiopulmonary disease.  No DVT in the right lower extremity on venous Doppler  CAD: Continue Lipitor, aspirin   Hypertension: Continue HCTZ, blood pressure remains elevated which is likely due to agitation.  Continue hydralazine 25 mg p.o. 3 times daily.  Monitor for now   Hypothyroidism: Continue 75 mcg of levothyroxine for now - free T4 and TSH are both elevated.   She could be missing her levothyroxine dose at assisted living.  Discussed with family importance of medication compliance  Sick sinus syndrome/bradycardia History of CVA NSVT Hyperlipidemia  Overall poor prognosis.  Family agreeable with palliative care referral at discharge.  She may be hospice appropriate     Body mass index is 20.03 kg/m.  Net IO Since Admission: -1,162.53 mL [07/12/20 1325]      Status is: Observation  The patient remains OBS appropriate and will d/c before 2 midnights.  Dispo: The patient is from: ALF              Anticipated d/c is to: ALF with palliative care              Patient currently is not medically stable to d/c.   Difficult to place patient No    DVT prophylaxis:       heparin injection 5,000 Units Start: 07/10/20 2200 SCDs Start: 07/10/20 2045     Family Communication: Discussed with patient's sister and brother over phone on 6/12   All the records are reviewed and case discussed with Care Management/Social Worker. Management plans discussed with the patient, family and they are in agreement.  CODE STATUS: Full Code Level of care: Med-Surg  TOTAL TIME TAKING CARE OF THIS PATIENT: 35 minutes.   More than 50% of  the time was spent in counseling/coordination of care: YES  POSSIBLE D/C IN 1 DAYS, DEPENDING ON CLINICAL CONDITION.   Delfino Lovett M.D on 07/12/2020 at 1:25 PM  [facility]   CC: Primary care physician; [facility]  Note: This dictation was prepared with Dragon dictation along with smaller phrase technology. Any transcriptional errors that result from this process are unintentional.

## 2020-07-12 NOTE — ED Notes (Signed)
Pt given warm blanket and heat turned up

## 2020-07-12 NOTE — ED Notes (Signed)
Lab at bedside.   Pt noted to void in bed, urine collected via suction canister. 150 ccs of output.

## 2020-07-12 NOTE — TOC Initial Note (Signed)
Transition of Care [facility]) - Initial/Assessment Note    Patient Details  Name: Sara Bailey MRN: [id number] Date of Birth: [birth year]  Transition of Care [facility]) CM/SW Contact:    Verna Czech [place], [place] Phone Number: [phone] 07/12/2020, 2:17 PM  Clinical Narrative:                 Patient from [facility]. Patient's brother and sister in law at bedside and are agreeable to patient's return to the facility tomorrow by EMS transport. They are agreeable to the Palliative care consult and to speaking with the administrator at [facility] regarding possible transition to the memory care unit of the facility due to progressing confusion and need for additional supervision.  [facility] to be arranged on 07/13/20 for patient's return to the facility.  Cordell Coke [address], LCSW Transition of Care [phone]   Expected Discharge Plan: Assisted Living Barriers to Discharge: Continued Medical Work up   Patient Goals and CMS Choice        Expected Discharge Plan and Services Expected Discharge Plan: Assisted Living In-house Referral: Clinical Social Work                                            Prior Living Arrangements/Services   Lives with:: Facility Resident Patient language and need for interpreter reviewed:: No        Need for Family Participation in Patient Care: Yes (Comment) Care giver support system in place?: Yes (comment)   Criminal Activity/Legal Involvement Pertinent to Current Situation/Hospitalization: No - Comment as needed  Activities of Daily Living Home Assistive Devices/Equipment: Wheelchair ADL Screening (condition at time of admission) Patient's cognitive ability adequate to safely complete daily activities?: No Is the patient deaf or have difficulty hearing?: No Does the patient have difficulty seeing, even when wearing glasses/contacts?: Yes Does the patient have difficulty concentrating, remembering, or making decisions?: Yes Patient able  to express need for assistance with ADLs?: Yes Does the patient have difficulty dressing or bathing?: Yes Independently performs ADLs?: No Communication: Needs assistance Dressing (OT): Needs assistance Grooming: Needs assistance Feeding: Needs assistance Bathing: Needs assistance Toileting: Needs assistance In/Out Bed: Needs assistance Walks in Home: Needs assistance Does the patient have difficulty walking or climbing stairs?: Yes Weakness of Legs: Both Weakness of Arms/Hands: Left  Permission Sought/Granted                  Emotional Assessment Appearance:: Appears older than stated age Attitude/Demeanor/Rapport: Guarded Affect (typically observed): Blunt Orientation: : Oriented to Self Alcohol / Substance Use: Not Applicable Psych Involvement: No (comment)  Admission diagnosis:  Confusion [R41.0] Altered mental status, unspecified altered mental status type [R41.82] AMS (altered mental status) [R41.82] Patient Active Problem List   Diagnosis Date Noted   Coronary artery disease 07/10/2020   Dementia with behavioral disturbance (HCC) 07/10/2020   Glaucoma 07/10/2020   Hypothyroidism 07/10/2020   Prediabetes 07/10/2020   Chronic kidney disease (CKD) stage G3a/A1, moderately decreased glomerular filtration rate (GFR) between 45-59 mL/min/1.73 square meter and albuminuria creatinine ratio less than 30 mg/g (HCC) 07/10/2020   AMS (altered mental status) 07/10/2020   Altered mental status 06/17/2018   Essential hypertension 07/11/2017   PCP:  [employer] Pharm[facility]:   [facility] - [place], [place] - [address] [place] [address] Phone: [phone] Fax: [phone]  Social Determinants of Health (SDOH) Interventions    Readmission Risk Interventions Readmission Risk Prevention Plan 06/18/2018  Transportation Screening Complete

## 2020-07-12 NOTE — ED Notes (Signed)
Provider notified via secure chat that pt has not voided since this RN assumed care and bladder scan showing >366ml of urine. Provider also notified that pt has not received antibiotics despite having elevate lactic of 2.1 at 1318 on 6/11. Awaiting new orders at this time.

## 2020-07-12 NOTE — ED Notes (Signed)
Orders received for LR @ 120ml/hr. Per provider via secure chat, RN will repeat bladder scan at 0300, if pt has yet to void.

## 2020-07-12 NOTE — ED Notes (Signed)
Dr at bedside.

## 2020-07-12 NOTE — ED Notes (Signed)
Lab called by this RN to come collect morning labs.  

## 2020-07-13 ENCOUNTER — Encounter: Payer: Self-pay | Admitting: Internal Medicine

## 2020-07-13 DIAGNOSIS — N1831 Chronic kidney disease, stage 3a: Secondary | ICD-10-CM | POA: Diagnosis not present

## 2020-07-13 DIAGNOSIS — I2510 Atherosclerotic heart disease of native coronary artery without angina pectoris: Secondary | ICD-10-CM | POA: Diagnosis not present

## 2020-07-13 DIAGNOSIS — R4182 Altered mental status, unspecified: Secondary | ICD-10-CM | POA: Diagnosis not present

## 2020-07-13 DIAGNOSIS — I10 Essential (primary) hypertension: Secondary | ICD-10-CM | POA: Diagnosis not present

## 2020-07-13 LAB — BASIC METABOLIC PANEL
Anion gap: 8 (ref 5–15)
BUN: 16 mg/dL (ref 8–23)
CO2: 24 mmol/L (ref 22–32)
Calcium: 10 mg/dL (ref 8.9–10.3)
Chloride: 109 mmol/L (ref 98–111)
Creatinine, Ser: 1.2 mg/dL — ABNORMAL HIGH (ref 0.44–1.00)
GFR, Estimated: 45 mL/min — ABNORMAL LOW (ref >60)
Glucose, Bld: 92 mg/dL (ref 70–99)
Potassium: 3.5 mmol/L (ref 3.5–5.1)
Sodium: 141 mmol/L (ref 135–145)

## 2020-07-13 LAB — CBC
HCT: 37.8 % (ref 36.0–46.0)
Hemoglobin: 12.6 g/dL (ref 12.0–15.0)
MCH: 28.4 pg (ref 26.0–34.0)
MCHC: 33.3 g/dL (ref 30.0–36.0)
MCV: 85.1 fL (ref 80.0–100.0)
Platelets: 242 10*3/uL (ref 150–400)
RBC: 4.44 MIL/uL (ref 3.87–5.11)
RDW: 15.5 % (ref 11.5–15.5)
WBC: 6.6 10*3/uL (ref 4.0–10.5)
nRBC: 0 % (ref 0.0–0.2)

## 2020-07-13 LAB — HEMOGLOBIN A1C
Hgb A1c MFr Bld: 6.6 % — ABNORMAL HIGH (ref 4.8–5.6)
Mean Plasma Glucose: 143 mg/dL

## 2020-07-13 NOTE — Progress Notes (Signed)
Patient is being discharged to [facility]. I called and gave report to [place]. Patient going via EMS.

## 2020-07-13 NOTE — Progress Notes (Signed)
IVT consulted for difficult stick.  Upon arrival, primary RN Debi states pt is being discharged and PIV no longer needed.

## 2020-07-13 NOTE — [place] FL2 (Addendum)
[place] MEDICAID FL2 LEVEL OF CARE SCREENING TOOL     IDENTIFICATION  Patient Name: Sara Bailey Birthdate: 04-Sep-1940 Sex: female Admission Date (Current Location): 07/10/2020  [facility] and [place] Number:  Chiropodist and Address:  [facility], [address] [place], [place] [address]      Provider Number: [id number]  Attending Physician Name and Address:  Delfino Lovett, MD  Relative Name and Phone Number:  Zara Chess (brother) [phone]    Current Level of Care: Hospital Recommended Level of Care: Assisted Living Facility Prior Approval Number:    Date Approved/Denied:   PASRR Number:    Discharge Plan:      Current Diagnoses: Patient Active Problem List   Diagnosis Date Noted   Coronary artery disease 07/10/2020   Dementia with behavioral disturbance (HCC) 07/10/2020   Glaucoma 07/10/2020   Hypothyroidism 07/10/2020   Prediabetes 07/10/2020   Chronic kidney disease (CKD) stage G3a/A1, moderately decreased glomerular filtration rate (GFR) between 45-59 mL/min/1.73 square meter and albuminuria creatinine ratio less than 30 mg/g (HCC) 07/10/2020   AMS (altered mental status) 07/10/2020   Altered mental status 06/17/2018   Essential hypertension 07/11/2017    Orientation RESPIRATION BLADDER Height & Weight     Self, Place  Normal External catheter Weight: 58 kg Height:  5\' 7"  (170.2 cm)  BEHAVIORAL SYMPTOMS/MOOD NEUROLOGICAL BOWEL NUTRITION STATUS      Continent Diet (Regular)  AMBULATORY STATUS COMMUNICATION OF NEEDS Skin   Extensive Assist (Use RW at all times- supervision for standing, ambulation, and transfers) Verbally Normal                       Personal Care Assistance Level of Assistance  Bathing, Feeding, Dressing Bathing Assistance: Limited assistance Feeding assistance: Limited assistance Dressing Assistance: Limited assistance     Functional Limitations Info  Hearing, Sight Sight Info:  Impaired Hearing Info: Adequate      SPECIAL CARE FACTORS FREQUENCY  PT (By licensed PT), OT (By licensed OT)     PT Frequency: PT at facility eval and treat 2-3 times per week OT Frequency: OT eval and treat at facility 2-3 times per week            Contractures Contractures Info: Not present    Additional Factors Info  Code Status, Allergies Code Status Info: Full Allergies Info: NKA           Current Medications (07/13/2020):  This is the current hospital active medication list Current Facility-Administered Medications  Medication Dose Route Frequency Provider Last Rate Last Admin   acetaminophen (TYLENOL) tablet 650 mg  650 mg Oral Q6H PRN 07/15/2020, MD       Or   acetaminophen (TYLENOL) suppository 650 mg  650 mg Rectal Q6H PRN Gertha Calkin, MD       amLODipine (NORVASC) tablet 10 mg  10 mg Oral Daily Gertha Calkin V, MD   10 mg at 07/13/20 1005   aspirin EC tablet 325 mg  325 mg Oral Daily 07/15/20, MD   325 mg at 07/13/20 1004   atorvastatin (LIPITOR) tablet 80 mg  80 mg Oral Daily 07/15/20 V, MD   80 mg at 07/13/20 1003   heparin injection 5,000 Units  5,000 Units Subcutaneous Q8H 07/15/20, MD   5,000 Units at 07/13/20 1451   hydrALAZINE (APRESOLINE) tablet 25 mg  25 mg Oral Q8H 07/15/20, MD   25 mg at 07/13/20  1452   hydrochlorothiazide (HYDRODIURIL) tablet 12.5 mg  12.5 mg Oral Daily Irena Cords V, MD   12.5 mg at 07/13/20 1004   lactated ringers infusion   Intravenous Continuous Manuela Schwartz, NP 100 mL/hr at 07/12/20 0022 New Bag at 07/12/20 0022   levothyroxine (SYNTHROID) tablet 75 mcg  75 mcg Oral Q0600 Ronnald Ramp, RPH   75 mcg at 07/13/20 1003   mirtazapine (REMERON) tablet 7.5 mg  7.5 mg Oral QHS Gertha Calkin, MD   7.5 mg at 07/12/20 2050     Discharge Medications: Medication List       TAKE these medications     atorvastatin 80 MG tablet Commonly known as: LIPITOR Take 80 mg by mouth at bedtime.    cholecalciferol 25  MCG (1000 UNIT) tablet Commonly known as: VITAMIN D3 Take 1,000 Units by mouth daily.    levothyroxine 75 MCG tablet Commonly known as: SYNTHROID Take 75 mcg by mouth daily before breakfast. Take on an empty stomach.    metoprolol succinate 25 MG 24 hr tablet Commonly known as: TOPROL-XL Take 25 mg by mouth daily. (Hold if HR <50)    mirtazapine 7.5 MG tablet Commonly known as: REMERON Take 7.5 mg by mouth at bedtime.    potassium chloride 10 MEQ tablet Commonly known as: KLOR-CON Take 10 mEq by mouth daily.    Relevant Imaging Results:  Relevant Lab Results:   Additional Information ss: [id number]  Allayne Butcher, RN

## 2020-07-13 NOTE — Evaluation (Signed)
Physical Therapy Evaluation Patient Details Name: Shamonique Battiste MRN: [id number] DOB: 1940/05/28 Today's Date: 07/13/2020   History of Present Illness  presented to ER secondary to AMS, R LE swelling; admitted for management of acute metabolic encephalopathy, mild UTI  Clinical Impression  Patient resting in bed upon arrival to room; alert and oriented to self, location as hospital only. Unable to relay reason for admission of provide details of living environment, social history.  However, follows simple commands (increased time for processing), pleasant and cooperative throughout session.  Bilat UE/LE strength and ROM grossly symmetrical and WFL for basic transfers and gait; no pain reported with movement or WBing.  Currently requiring mod assist for bed mobility; mod assist for sit/stand, basic transfers and gait (5') with RW.  Demonstrates shuffling gait pattern, requiring manual facilitation for weight shift to unweight/advance LEs; limited balance reactions evident. Hand-over-hand assist to guide/negotiate RW. Recommend use of RW and +1 hands-on assist at all times to optimize safety with mobility attempts Would benefit from skilled PT to address above deficits and promote optimal return to PLOF.; Recommend transition to HHPT upon discharge from acute hospitalization, provided facility able to provide appropriate level of support.  Do anticipate optimal performance, participation and carry-over of any therapeutic intervention will occur in patient's normal living environment, routine and familiar caregivers if possible.     Follow Up Recommendations Home health PT (anticipate optimal therapeutic benefit with therapy services in prior living environment, routine, caregivers)    Equipment Recommendations       Recommendations for Other Services       Precautions / Restrictions Precautions Precautions: Fall Restrictions Weight Bearing Restrictions: No      Mobility  Bed  Mobility Overal bed mobility: Needs Assistance Bed Mobility: Supine to Sit     Supine to sit: Mod assist     General bed mobility comments: difficulty sequencing movement; does assist once positioned/initiated by therapist    Transfers Overall transfer level: Needs assistance Equipment used: Rolling walker (2 wheeled) Transfers: Sit to/from Stand Sit to Stand: Mod assist         General transfer comment: assist for lift off, hand placement, anteriro weight translation and overall balance  Ambulation/Gait Ambulation/Gait assistance: Min assist;Mod assist Gait Distance (Feet): 5 Feet Assistive device: Rolling walker (2 wheeled)       General Gait Details: shuffling gait pattern, requiring manual facilitation for weight shift to unweight/advance LEs; limited balance reactions evident. Hand-over-hand assist to guide/negotiate RW. Recommend use of RW and +1 hands-on assist at all times to optimize safety with mobility attempts  Stairs            Wheelchair Mobility    Modified Rankin (Stroke Patients Only)       Balance Overall balance assessment: Needs assistance Sitting-balance support: No upper extremity supported;Feet supported Sitting balance-Leahy Scale: Good     Standing balance support: Bilateral upper extremity supported Standing balance-Leahy Scale: Fair                               Pertinent Vitals/Pain Pain Assessment: No/denies pain    Home Living Family/patient expects to be discharged to:: Assisted living                 Additional Comments: Resident of [facility]    Prior Function Level of Independence: Needs assistance         Comments: Patient limited historian.  Per TOC, patient indep  with ADLs, does have/use WC as needed for mobility.     Hand Dominance        Extremity/Trunk Assessment   Upper Extremity Assessment Upper Extremity Assessment: Overall WFL for tasks assessed    Lower Extremity  Assessment Lower Extremity Assessment: Generalized weakness (grossly 4-/5 throughout)       Communication   Communication: No difficulties  Cognition Arousal/Alertness: Awake/alert Behavior During Therapy: WFL for tasks assessed/performed Overall Cognitive Status: No family/caregiver present to determine baseline cognitive functioning                                 General Comments: Alert and oriented to self, location as hospital; unaware of reason for admission; unable to relay any details of social history, PLOF      General Comments      Exercises     Assessment/Plan    PT Assessment Patient needs continued PT services  PT Problem List Decreased strength;Decreased activity tolerance;Decreased balance;Decreased mobility;Decreased cognition;Decreased knowledge of use of DME;Decreased safety awareness       PT Treatment Interventions DME instruction;Gait training;Functional mobility training;Therapeutic activities;Therapeutic exercise;Balance training;Patient/family education;Cognitive remediation    PT Goals (Current goals can be found in the Care Plan section)  Acute Rehab PT Goals Patient Stated Goal: agreeable to participation with session PT Goal Formulation: With patient Time For Goal Achievement: 07/27/20 Potential to Achieve Goals: Fair    Frequency Min 2X/week   Barriers to discharge        Co-evaluation               AM-PAC PT "6 Clicks" Mobility  Outcome Measure Help needed turning from your back to your side while in a flat bed without using bedrails?: A Little Help needed moving from lying on your back to sitting on the side of a flat bed without using bedrails?: A Lot Help needed moving to and from a bed to a chair (including a wheelchair)?: A Lot Help needed standing up from a chair using your arms (e.g., wheelchair or bedside chair)?: A Lot Help needed to walk in hospital room?: A Lot Help needed climbing 3-5 steps with a  railing? : A Lot 6 Click Score: 13    End of Session Equipment Utilized During Treatment: Gait belt Activity Tolerance: Patient tolerated treatment well Patient left: in chair;with call bell/phone within reach;with chair alarm set Nurse Communication: Mobility status PT Visit Diagnosis: Muscle weakness (generalized) (M62.81);Difficulty in walking, not elsewhere classified (R26.2)    Time: 8413-2440 PT Time Calculation (min) (ACUTE ONLY): 17 min   Charges:   PT Evaluation $PT Eval Moderate Complexity: 1 Mod          Zamauri Nez H. Manson Passey, PT, DPT, NCS 07/13/20, 1:08 PM [phone]

## 2020-07-13 NOTE — Progress Notes (Signed)
[facility] [employer], contracting [facility]) Hospital Liaison note:  Notified by Deboraha Sprang, RN, of request for [facility] Palliative Care services. Will continue to follow for disposition.  Please call with any outpatient palliative questions or concerns.  Thank you for the opportunity to participate in this patient's care.  Thank you, Abran Cantor, LPN [facility] Liaison [phone]

## 2020-07-13 NOTE — TOC Transition Note (Signed)
Transition of Care [facility]) - CM/SW Discharge Note   Patient Details  Name: Sara Bailey MRN: [id number] Date of Birth: Jun 05, 1940  Transition [facility] Care [facility]) CM/SW Contact:  Allayne Butcher, RN Phone Number: 07/13/2020, 3:43 PM   Clinical Narrative:    Patient medically cleared for discharge back to [facility] today.  Misty Stanley from [facility] came to assess patient here at the hospital, then Elmarie Shiley and Misty Stanley had a meeting with the patient's brother, Romeo Apple.  Patient will require additional assistance at the facility which require additional cost.  Brother reports that he would like patient to return today.  FL2 and discharge summary faxed to [facility].  EMS transport has been arranged.  Patient is 4th on the list for pick up.     Final next level of care: Assisted Living Barriers to Discharge: Barriers Resolved   Patient Goals and CMS Choice Patient states their goals for this hospitalization and ongoing recovery are:: Brother and sister in law want the patient to return to [facility].gov Compare Post Acute Care list provided to:: Patient Represenative (must comment) Choice offered to / list presented to : Sibling  Discharge Placement              Patient chooses bed at: Other - please specify in the comment section below: ([facility]) Patient to be transferred to facility by: [facility] Name of family member notified: Romeo Apple Patient and family notified of of transfer: 07/13/20  Discharge Plan and Services In-house Referral: Clinical Social Work              DME Arranged: N/A DME Agency: NA       HH Arranged: PT, OT HH Agency: [facility] Date HH Agency Contacted: 07/13/20 Time HH Agency Contacted: 1542 Representative spoke with at [facility] Agency: Tiffany at facility  Social Determinants of Health (SDOH) Interventions     Readmission Risk Interventions Readmission Risk Prevention Plan 06/18/2018  Transportation Screening Complete

## 2020-07-13 NOTE — Discharge Summary (Addendum)
[facility] at [facility]   PATIENT NAME: Sara Bailey    MR#:  [id number]  DATE OF BIRTH:  April 24, 1940  DATE OF ADMISSION:  07/10/2020   ADMITTING PHYSICIAN: Gertha Calkin, MD  DATE OF DISCHARGE: 07/13/2020  PRIMARY CARE PHYSICIAN: [facility]   ADMISSION DIAGNOSIS:  Confusion [R41.0] Altered mental status, unspecified altered mental status type [R41.82] AMS (altered mental status) [R41.82] DISCHARGE DIAGNOSIS:  Principal Problem:   Altered mental status Active Problems:   Coronary artery disease   Dementia with behavioral disturbance (HCC)   Essential hypertension   Glaucoma   Hypothyroidism   Prediabetes   Chronic kidney disease (CKD) stage G3a/A1, moderately decreased glomerular filtration rate (GFR) between 45-59 mL/min/1.73 square meter and albuminuria creatinine ratio less than 30 mg/g (HCC)  SECONDARY DIAGNOSIS:   Past Medical History:  Diagnosis Date   Coronary artery disease    Hypertension    Stroke [facility])    HOSPITAL COURSE:  Patient is a 80 year old female with a known history of hypertension, hyperlipidemia, CVA is admitted for mental status changes   Acute metabolic encephalopathy - POA and now resolved. Her mental status is at base line Likely multifactorial -advancing age, progressive dementia, lack of sleep, mild UTI, dehydration and may be missing some of her medication including thyroid as her TSH was high on admission Patient is a resident at [facility] assisted [facility] per family.  She does have confusion at baseline but has gotten worse since last Tuesday and family was concerned that she may have some infection Her UA is concerning for mild UTI -urine culture shows less than 10,000 colonies. No further need of Abx at DC - underlying dementia with behavioral disturbances -CT head is negative, carotid Doppler shows no hemodynamically significant stenosis, MRI of the brain negative for acute stroke -Chest x-ray shows no acute  cardiopulmonary disease.  No DVT in the right lower extremity on venous Doppler   CAD: Continue Lipitor, aspirin   Hypertension: Controlled on home regimen   Hypothyroidism: Continue 75 mcg of levothyroxine for now - free T4 and TSH are both elevated.   She could be missing her levothyroxine dose at assisted living.  Discussed with family importance of medication compliance. Recommend rechecking TFTs in 2-3 weeks as an outpt   Sick sinus syndrome/bradycardia History of CVA NSVT Hyperlipidemia  Considering her Progressive Dementia with behavioral disturbances - family is considering transition to memory care unit at [facility].   Overall poor prognosis.  Family agreeable with palliative care referral at discharge.  DISCHARGE CONDITIONS:  fair CONSULTS OBTAINED:   DRUG ALLERGIES:  No Known Allergies DISCHARGE MEDICATIONS:   Allergies as of 07/13/2020   No Known Allergies      Medication List     TAKE these medications    atorvastatin 80 MG tablet Commonly known as: LIPITOR Take 80 mg by mouth at bedtime.   cholecalciferol 25 MCG (1000 UNIT) tablet Commonly known as: VITAMIN D3 Take 1,000 Units by mouth daily.   levothyroxine 75 MCG tablet Commonly known as: SYNTHROID Take 75 mcg by mouth daily before breakfast. Take on an empty stomach.   metoprolol succinate 25 MG 24 hr tablet Commonly known as: TOPROL-XL Take 25 mg by mouth daily. (Hold if HR <50)   mirtazapine 7.5 MG tablet Commonly known as: REMERON Take 7.5 mg by mouth at bedtime.   potassium chloride 10 MEQ tablet Commonly known as: KLOR-CON Take 10 mEq by mouth daily.       DISCHARGE  INSTRUCTIONS:  Please crush meds and give with applesauce. DIET:  Regular diet DISCHARGE CONDITION:  Stable ACTIVITY:  Activity as tolerated OXYGEN:  Home Oxygen: No.  Oxygen Delivery: room air DISCHARGE LOCATION:  [facility] with Palliative care to follow   If you experience worsening of your  admission symptoms, develop shortness of breath, life threatening emergency, suicidal or homicidal thoughts you must seek medical attention immediately by calling 911 or calling your MD immediately  if symptoms less severe.  You Must read complete instructions/literature along with all the possible adverse reactions/side effects for all the Medicines you take and that have been prescribed to you. Take any new Medicines after you have completely understood and accpet all the possible adverse reactions/side effects.   Please note  You were cared for by a hospitalist during your hospital stay. If you have any questions about your discharge medications or the care you received while you were in the hospital after you are discharged, you can call the unit and asked to speak with the hospitalist on call if the hospitalist that took care of you is not available. Once you are discharged, your primary care physician will handle any further medical issues. Please note that NO REFILLS for any discharge medications will be authorized once you are discharged, as it is imperative that you return to your primary care physician (or establish a relationship with a primary care physician if you do not have one) for your aftercare needs so that they can reassess your need for medications and monitor your lab values.    On the day of Discharge:  VITAL SIGNS:  Blood pressure (!) 134/59, pulse 65, temperature (!) 97.5 F (36.4 C), temperature source Oral, resp. rate 15, height 5\' 7"  (1.702 m), weight 58 kg, SpO2 99 %. PHYSICAL EXAMINATION:  GENERAL:  80 y.o.-year-old patient lying in the bed with no acute distress.  EYES: Pupils equal, round, reactive to light and accommodation. No scleral icterus. Extraocular muscles intact.  HEENT: Head atraumatic, normocephalic. Oropharynx and nasopharynx clear.  NECK:  Supple, no jugular venous distention. No thyroid enlargement, no tenderness.  LUNGS: Normal breath sounds  bilaterally, no wheezing, rales,rhonchi or crepitation. No use of accessory muscles of respiration.  CARDIOVASCULAR: S1, S2 normal. No murmurs, rubs, or gallops.  ABDOMEN: Soft, non-tender, non-distended. Bowel sounds present. No organomegaly or mass.  EXTREMITIES: No pedal edema, cyanosis, or clubbing.  NEUROLOGIC: Cranial nerves II through XII are intact. Muscle strength 5/5 in all extremities. Sensation intact. Gait not checked.  PSYCHIATRIC: The patient is alert and awake SKIN: No obvious rash, lesion, or ulcer.  DATA REVIEW:   CBC Recent Labs  Lab 07/13/20 0539  WBC 6.6  HGB 12.6  HCT 37.8  PLT 242    Chemistries  Recent Labs  Lab 07/12/20 0332 07/13/20 0539  NA 141 141  K 4.2 3.5  CL 107 109  CO2 24 24  GLUCOSE 115* 92  BUN 14 16  CREATININE 1.17* 1.20*  CALCIUM 10.4* 10.0  AST 24  --   ALT 13  --   ALKPHOS 91  --   BILITOT 1.1  --      Outpatient follow-up  Follow-up Information     [facility] Schedule an appointment as soon as possible for a visit in 1 week(s).   Specialty: Geriatric Medicine Why: [facility] Discharge F/UP Contact information: [address] [place] [phone]  Management plans discussed with the patient, family and they are in agreement.  CODE STATUS: Full Code   TOTAL TIME TAKING CARE OF THIS PATIENT: 45 minutes.    Delfino Lovett M.D on 07/13/2020 at 7:59 AM  [facility]   CC: Primary care physician; [facility]   Note: This dictation was prepared with Dragon dictation along with smaller phrase technology. Any transcriptional errors that result from this process are unintentional.

## 2020-07-16 LAB — CULTURE, BLOOD (ROUTINE X 2)
Culture: NO GROWTH
Special Requests: ADEQUATE

## 2020-07-22 ENCOUNTER — Encounter: Payer: Self-pay | Admitting: Adult Health Nurse Practitioner

## 2020-07-22 ENCOUNTER — Other Ambulatory Visit: Payer: Self-pay

## 2020-07-22 ENCOUNTER — Non-Acute Institutional Stay: Payer: Medicare PPO | Admitting: Adult Health Nurse Practitioner

## 2020-07-22 VITALS — HR 54

## 2020-07-22 DIAGNOSIS — F0391 Unspecified dementia with behavioral disturbance: Secondary | ICD-10-CM

## 2020-07-22 DIAGNOSIS — Z515 Encounter for palliative care: Secondary | ICD-10-CM

## 2020-07-22 NOTE — Progress Notes (Signed)
[facility] Consult Note Telephone: [phone]  Fax: [phone]    Date of encounter: 07/22/20 PATIENT NAME: Sara Bailey [address] [place] [address]   [phone] (home)  DOB: 1940-07-12 MRN: [id number] PRIMARY CARE PROVIDER:    [facility],  [place] [address] [phone]  REFERRING PROVIDER:   [facility] [address] [place],  [place] [address] [phone]  RESPONSIBLE PARTY:    Contact Information     Name Relation Home Work [place] Brother   [phone]   [place] Sister   [phone]        I met face to face with patient in facility. Palliative Care was asked to follow this patient by consultation request of  [facility]* to address advance care planning and complex medical decision making. This is the initial visit.   Called brother to update on today's visit.  Left VM with reason for call and callback info                                  ASSESSMENT AND PLAN / RECOMMENDATIONS:   Advance Care Planning/Goals of Care: Goals include to maximize quality of life and symptom management.  Has advanced directives uploaded in epic CODE STATUS: full code  Symptom Management/Plan:  Dementia: Patient having functional decline and is requiring more assistance and per report from staff is also having more of a cognitive decline.  Patient would probably benefit more being in the memory care unit.  We will discuss this further with brother.  Did leave voice message for the brother with my contact information.   Follow up Palliative Care Visit: Palliative care will continue to follow for complex medical decision making, advance care planning, and clarification of goals. Return 6-8 weeks or prn.  I spent 45 minutes providing this consultation. More than 50% of the time in this consultation was spent in  counseling and care coordination.   PPS: 40-30%  HOSPICE ELIGIBILITY/DIAGNOSIS: TBD  Chief Complaint: initial palliative visit  HISTORY OF PRESENT ILLNESS:  Sara Bailey is a 80 y.o. year old female  with dementia, CAD, hypothyroidism, HTN, HLD, h/o CVA.  Patient had hospitalization 6/10 through 07/13/2020 for AMS.  She did have mild UTI.  CT of head was negative, carotid Doppler showed no significant stenosis, chest x-ray showed no acute findings, no DVT noted on venous Doppler.  Patient states that she is fine and has no concerns.  HPI/ROS unreliable secondary to dementia.  Patient is alert and oriented to person only.  Today she thinks the year is 74 and that she is in [place].  Staff does report that she is offering less assistance with transfers and does not try to walk with walker anymore.  Uses wheelchair to get around.  Staff does report that she requires 2 person assist with transfers and ADLs.  Staff does report that she seems to be having more cognitive decline.  She is currently in the ALF part of [facility] and discharge summary from hospital does indicate family may be interested in transitioning her to the memory care unit of the facility.  History obtained from review of EMR and interview with facility staff and Sara Bailey.  I reviewed available labs, medications, imaging, studies and related documents from the EMR.  Records reviewed and summarized above.   Physical  Exam:  Constitutional: NAD General: frail appearing, WNWD EYES: anicteric sclera, lids intact, no discharge  ENMT: intact hearing, oral mucous membranes moist CV: S1S2, RRR, no LE edema Pulmonary: LCTA, no increased work of breathing, no cough Abdomen:  normo-active BS + 4 quadrants, soft and non tender MSK: patient uses wheelchair to get around Skin: warm and dry, no rashes on visible skin Neuro:  A and O to person only today Hem/lymph/immuno: no widespread bruising   CURRENT PROBLEM LIST:  Patient Active  Problem List   Diagnosis Date Noted   Coronary artery disease 07/10/2020   Dementia with behavioral disturbance ([place]) 07/10/2020   Glaucoma 07/10/2020   Hypothyroidism 07/10/2020   Prediabetes 07/10/2020   Chronic kidney disease (CKD) stage G3a/A1, moderately decreased glomerular filtration rate (GFR) between 45-59 mL/min/1.73 square meter and albuminuria creatinine ratio less than 30 mg/g ([place]) 07/10/2020   AMS (altered mental status) 07/10/2020   Altered mental status 06/17/2018   Essential hypertension 07/11/2017   PAST MEDICAL HISTORY:  Active Ambulatory Problems    Diagnosis Date Noted   Altered mental status 06/17/2018   Coronary artery disease 07/10/2020   Dementia with behavioral disturbance ([place]) 07/10/2020   Essential hypertension 07/11/2017   Glaucoma 07/10/2020   Hypothyroidism 07/10/2020   Prediabetes 07/10/2020   Chronic kidney disease (CKD) stage G3a/A1, moderately decreased glomerular filtration rate (GFR) between 45-59 mL/min/1.73 square meter and albuminuria creatinine ratio less than 30 mg/g ([place]) 07/10/2020   AMS (altered mental status) 07/10/2020   Resolved Ambulatory Problems    Diagnosis Date Noted   No Resolved Ambulatory Problems   Past Medical History:  Diagnosis Date   Hypertension    Stroke ([place])    SOCIAL HX:  Social History   Tobacco Use   Smoking status: Former    Pack years: 0.00    Types: Cigarettes    Quit date: 2019    Years since quitting: 3.4   Smokeless tobacco: Never  Substance Use Topics   Alcohol use: Never   FAMILY HX:  Family History  Problem Relation Age of Onset   Stroke Father    Heart attack Brother       ALLERGIES: No Known Allergies   PERTINENT MEDICATIONS:  Outpatient Encounter Medications as of 07/22/2020  Medication Sig   atorvastatin (LIPITOR) 80 MG tablet Take 80 mg by mouth at bedtime.   cholecalciferol (VITAMIN D3) 25 MCG (1000 UNIT) tablet Take 1,000 Units by mouth daily.   levothyroxine (SYNTHROID) 75  MCG tablet Take 75 mcg by mouth daily before breakfast. Take on an empty stomach.   metoprolol succinate (TOPROL-XL) 25 MG 24 hr tablet Take 25 mg by mouth daily. (Hold if HR <50)   mirtazapine (REMERON) 7.5 MG tablet Take 7.5 mg by mouth at bedtime.   potassium chloride (KLOR-CON) 10 MEQ tablet Take 10 mEq by mouth daily.   No facility-administered encounter medications on file as of 07/22/2020.    Thank you for the opportunity to participate in the care of Sara Bailey.  The palliative care team will continue to follow. Please call our office at [phone] if we can be of additional assistance.   Euphemia Lingerfelt Jenetta Downer, NP , DNP  This chart was dictated using voice recognition software.  Despite best efforts to proofread,  errors can occur which can change the documentation meaning.   COVID-19 PATIENT SCREENING TOOL Asked and negative response unless otherwise noted:   Have you had symptoms of covid, tested positive or been in contact with someone  with symptoms/positive test in the past 5-10 days?

## 2020-08-05 ENCOUNTER — Encounter: Payer: Self-pay | Admitting: Podiatry

## 2020-08-05 ENCOUNTER — Ambulatory Visit (INDEPENDENT_AMBULATORY_CARE_PROVIDER_SITE_OTHER): Payer: Medicare PPO | Admitting: Podiatry

## 2020-08-05 ENCOUNTER — Other Ambulatory Visit: Payer: Self-pay

## 2020-08-05 DIAGNOSIS — B351 Tinea unguium: Secondary | ICD-10-CM

## 2020-08-05 DIAGNOSIS — M79674 Pain in right toe(s): Secondary | ICD-10-CM | POA: Diagnosis not present

## 2020-08-05 DIAGNOSIS — M79675 Pain in left toe(s): Secondary | ICD-10-CM

## 2020-08-08 NOTE — Progress Notes (Signed)
  Subjective:  Patient ID: Sara Bailey, female    DOB: 06-Sep-1940,  MRN: [id number]  Chief Complaint  Patient presents with   Nail Problem    Nail trim, [facility]  No complaints    80 y.o. female presents with the above complaint. History confirmed with patient.   Objective:  Physical Exam: warm, good capillary refill, no trophic changes or ulcerative lesions, normal DP and PT pulses, and normal sensory exam. Left Foot: dystrophic yellowed discolored nail plates with subungual debris Right Foot: dystrophic yellowed discolored nail plates with subungual debris   Assessment:   1. Pain due to onychomycosis of toenails of both feet      Plan:  Patient was evaluated and treated and all questions answered.  Discussed the etiology and treatment options for the condition in detail with the patient. Educated patient on the topical and oral treatment options for mycotic nails. Recommended debridement of the nails today. Sharp and mechanical debridement performed of all painful and mycotic nails today. Nails debrided in length and thickness using a nail nipper to level of comfort. Discussed treatment options including appropriate shoe gear. Follow up as needed for painful nails.  Return in about 3 months (around 11/05/2020) for routine nail care.

## 2020-10-07 ENCOUNTER — Telehealth: Payer: Self-pay | Admitting: Student

## 2020-10-07 NOTE — Telephone Encounter (Signed)
Palliative NP called to see if patient was at facility for f/u visit. Facility states she discharged home. Will see if palliative order can be obtained to follow patient in community.

## 2020-10-28 ENCOUNTER — Other Ambulatory Visit: Payer: Self-pay

## 2020-10-28 ENCOUNTER — Non-Acute Institutional Stay: Payer: Medicare PPO | Admitting: Primary Care

## 2020-10-28 DIAGNOSIS — Z515 Encounter for palliative care: Secondary | ICD-10-CM

## 2020-10-28 DIAGNOSIS — F0391 Unspecified dementia with behavioral disturbance: Secondary | ICD-10-CM

## 2020-10-28 NOTE — Progress Notes (Signed)
[facility] Consult Note Telephone: [phone]  Fax: [phone]   Date of encounter: 10/28/20 1:37 PM PATIENT NAME: Sara Bailey [address] [place] [address]   [phone] (home)  DOB: 22-Dec-1940 MRN: [id number] PRIMARY CARE PROVIDER:    [facility],  [place] [address] [place] [address] [phone]  REFERRING PROVIDER:   [facility] [place] [address] [place],  [place] [address] [phone]  RESPONSIBLE PARTY:    Contact Information     Name Relation Home Work [place] Brother   [phone]   [place] Sister   [phone]        I met face to face with patient In [facility] facility. Palliative Care was asked to follow this patient by consultation request of  [facility] to address advance care planning and complex medical decision making. This is the initial visit.                                    ASSESSMENT AND PLAN / RECOMMENDATIONS:   Advance Care Planning/Goals of Care: Goals include to maximize quality of life and symptom management. Patient/health care surrogate gave his/her permission to discuss.Our advance care planning conversation included a discussion about:    T/c to [place], no answer, message left. Identification of a health care agent - brother Suezanne Jacquet Review of an  advance directive document- available at nursing home, copies uploaded to [facility].  CODE STATUS: DNR, MOST with comfort measures, no feeding tube option chosen,will discuss with POA.  Symptom Management/Plan:  Patient referred for palliative services. Has had decline from ALF to LTC in 3-4 months, requiring assistance in all adls. Brother is POA, not able to be reached today. ACP documents reviewed, pt has DNR and comfort measures in place. FAST score today appears to be 7 c-d.I will continue to follow to assess palliative needs and recommend  measures for comfort management.   Follow up Palliative Care Visit: Palliative care will continue to follow for complex medical decision making, advance care planning, and clarification of goals. Return 4 weeks or prn.  I spent 35 minutes providing this consultation. More than 50% of the time in this consultation was spent in counseling and care coordination.  PPS: 30%  HOSPICE ELIGIBILITY/DIAGNOSIS: TBD  Chief Complaint: debility, dementia  HISTORY OF PRESENT ILLNESS:  Sara Bailey is a 80 y.o. year old female  with dementia, CAD, hypothyroidism, HTN, HLD, h/o CVA.  She was seen by [facility] in ALF 3 months ago and since that time has declined to need SNF services. She is minimally interactive answering yes/ no questions only, limited by her dementia. [place] Kitchen   History obtained from review of EMR, discussion with primary team, and interview with family, facility staff/caregiver and/or Ms. Demarcus.  I reviewed available labs, medications, imaging, studies and related documents from the EMR.  Records reviewed and summarized above.   ROS/staff   General: NAD ENMT: denies dysphagia Pulmonary: denies cough, denies increased SOB Abdomen: endorses fair appetite, denies constipation, endorses cinontinence of bowel GU: denies dysuria, endorses incontinence of urine MSK:  endorses weakness,  no falls reported Skin: denies rashes or wounds Neurological: denies pain, denies insomnia Psych: Endorses withdrawn mood Heme/lymph/immuno: denies bruises, abnormal bleeding  Physical Exam: Current and past weights:unavailable Constitutional: NAD General: frail appearing, thin EYES: anicteric sclera, lids intact, no discharge  ENMT: intact hearing, oral mucous membranes moist, dentition intact CV: S1S2, RRR, no LE edema Pulmonary: LCTA, no increased work of breathing, no cough, room air Abdomen: intake 25-50%, soft and non tender, no ascites GU: deferred MSK: +sarcopenia, moves all extremities, non  ambulatory Skin: warm and dry, no rashes or wounds on visible skin Neuro:  + generalized weakness,  severe cognitive impairment, FAST 7 c-d. Psych: non-anxious affect, A and O x 1 Hem/lymph/immuno: no widespread bruising CURRENT PROBLEM LIST:  Patient Active Problem List   Diagnosis Date Noted   Coronary artery disease 07/10/2020   Dementia with behavioral disturbance ([place]) 07/10/2020   Glaucoma 07/10/2020   Hypothyroidism 07/10/2020   Prediabetes 07/10/2020   Chronic kidney disease (CKD) stage G3a/A1, moderately decreased glomerular filtration rate (GFR) between 45-59 mL/min/1.73 square meter and albuminuria creatinine ratio less than 30 mg/g ([place]) 07/10/2020   AMS (altered mental status) 07/10/2020   Encephalopathy chronic 06/22/2018   Altered mental status 06/17/2018   Former tobacco use 12/04/2017   Essential hypertension 07/11/2017   CVA (cerebral vascular accident) ([place]) 07/11/2017   Hyperlipidemia 07/11/2017   Personal history of transient ischemic attack (TIA), 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 residual deficits 07/11/2017   PAST MEDICAL HISTORY:  Active Ambulatory Problems    Diagnosis Date Noted   Altered mental status 06/17/2018   Coronary artery disease 07/10/2020   Dementia with behavioral disturbance ([place]) 07/10/2020   Essential hypertension 07/11/2017   Glaucoma 07/10/2020   Hypothyroidism 07/10/2020   Prediabetes 07/10/2020   Chronic kidney disease (CKD) stage G3a/A1, moderately decreased glomerular filtration rate (GFR) between 45-59 mL/min/1.73 square meter and albuminuria creatinine ratio less than 30 mg/g ([place]) 07/10/2020   AMS (altered mental status) 07/10/2020   CVA (cerebral vascular accident) ([place]) 07/11/2017   Encephalopathy chronic 06/22/2018   Former tobacco use 12/04/2017   Hyperlipidemia 07/11/2017   Personal history of transient ischemic attack (TIA), 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 residual deficits 07/11/2017   Resolved Ambulatory Problems     Diagnosis Date Noted   No Resolved Ambulatory Problems   Past Medical History:  Diagnosis Date   Hypertension    Stroke [facility])    SOCIAL HX:  Social History   Tobacco Use   Smoking status: Former    Types: Cigarettes    Quit date: 2019    Years since quitting: 3.7   Smokeless tobacco: Never  Substance Use Topics   Alcohol use: Never   FAMILY HX:  Family History  Problem Relation Age of Onset   Stroke Father    Heart attack Brother       ALLERGIES: No Known Allergies   PERTINENT MEDICATIONS:  Outpatient Encounter Medications as of 10/28/2020  Medication Sig   amLODipine (NORVASC) 10 MG tablet Take 1 tablet by mouth daily.   aspirin 81 MG chewable tablet Chew by mouth.   atorvastatin (LIPITOR) 80 MG tablet Take 80 mg by mouth at bedtime.   atorvastatin (LIPITOR) 80 MG tablet Take 1 tablet by mouth daily.   cholecalciferol (VITAMIN D3) 25 MCG (1000 UNIT) tablet Take 1,000 Units by mouth daily.   donepezil (ARICEPT) 5 MG tablet Take 5 mg by mouth at bedtime.   hydrochlorothiazide (HYDRODIURIL) 12.5 MG tablet Take 1 tablet by mouth daily.   latanoprost (XALATAN) 0.005 % ophthalmic solution Apply to eye.   levothyroxine (SYNTHROID) 75 MCG tablet Take 75 mcg by mouth daily before breakfast. Take on an empty stomach.   metoprolol succinate (TOPROL-XL) 25 MG 24 hr tablet Take 25 mg by  mouth daily. (Hold if HR <50)   mirtazapine (REMERON) 7.5 MG tablet Take 7.5 mg by mouth at bedtime.   potassium chloride (KLOR-CON) 10 MEQ tablet Take 10 mEq by mouth daily.   No facility-administered encounter medications on file as of 10/28/2020.   Thank you for the opportunity to participate in the care of Ms. Cancelliere.  The palliative care team will continue to follow. Please call our office at [phone] if we can be of additional assistance.   Jason Coop, NP , DNP, AGPCNP-BC  COVID-19 PATIENT SCREENING TOOL Asked and negative response unless otherwise noted:  Have you had  symptoms of covid, tested positive or been in contact with someone with symptoms/positive test in the past 5-10 days?

## 2020-11-19 ENCOUNTER — Non-Acute Institutional Stay: Payer: Medicare PPO | Admitting: Primary Care

## 2020-11-19 ENCOUNTER — Other Ambulatory Visit: Payer: Self-pay

## 2020-11-19 DIAGNOSIS — F03918 Unspecified dementia, unspecified severity, with other behavioral disturbance: Secondary | ICD-10-CM

## 2020-11-19 DIAGNOSIS — Z515 Encounter for palliative care: Secondary | ICD-10-CM

## 2020-11-19 NOTE — Progress Notes (Signed)
[facility] Consult Note Telephone: [phone]  Fax: [phone]    Date of encounter: 11/19/20 2:13 PM PATIENT NAME: Sara Bailey [address] [place] [address]   [phone] (home)  DOB: 1940-11-29 MRN: [id number] PRIMARY CARE PROVIDER:    [facility],  [place] [address] [phone]  REFERRING PROVIDER:   Alvester Morin, MD [phone] [address] [address] [employer],  [place] [address] [phone]   RESPONSIBLE PARTY:    Contact Information     Name Relation Home Work Mobile   Rogers,Ben Brother   [phone]   [place] Sister   [phone]        I met face to face with patient in [facility]. Palliative Care was asked to follow this patient by consultation request of  Slade-Hartman, Ivette Loyal*  to address advance care planning and complex medical decision making. This is a follow up visit.                                   ASSESSMENT AND PLAN / RECOMMENDATIONS:   Advance Care Planning/Goals of Care: Goals include to maximize quality of life and symptom management.  CODE STATUS: DNR  Symptom Management/Plan:  Patient seated in her chair, watching tv.She is able to answer a few questions yes/no but not converse, FAST score 7B-C. She is eating well and has had a 10 lb weight gain. She appears WNWD. Staff do not report any dementia behaviors that need addressing. Pt sister also resides in this SNF and their brother is POA for both.  Follow up Palliative Care Visit: Palliative care will continue to follow for complex medical decision making, advance care planning, and clarification of goals. Return 8 weeks or prn.  I spent 15 minutes providing this consultation. More than 50% of the time in this consultation was spent in counseling and care coordination.  PPS: 30%  HOSPICE ELIGIBILITY/DIAGNOSIS: no  Chief Complaint: dementia  HISTORY OF  PRESENT ILLNESS:  Sara Bailey is a 80 y.o. year old female  with dementia, CVA, CAD, immobility. .   History obtained from review of EMR, discussion with primary team, and interview with family, facility staff/caregiver and/or [name].  I reviewed available labs, medications, imaging, studies and related documents from the EMR.  Records reviewed and summarized above.   ROS/ staff  General: NAD ENMT: denies dysphagia Pulmonary: denies cough, denies increased SOB Abdomen: endorses fair appetite, denies constipation, endorses incontinence of bowel GU: denies dysuria, endorses incontinence of urine MSK:  endorses  weakness,  no falls reported Skin: denies rashes or wounds Neurological: denies pain, denies insomnia Psych: Endorses positive mood Heme/lymph/immuno: denies bruises, abnormal bleeding  Physical Exam: Current and past weights:gained 10 lbs over past 4 months,138 lbs Constitutional: NAD General: frail appearing, WNWD EYES: anicteric sclera, lids intact, no discharge  ENMT: intact hearing, oral mucous membranes moist, dentition intact CV: S1S2, RRR, 1+ bil LE edema Pulmonary: LCTA, no increased work of breathing, no cough, room air Abdomen: intake 25-50%, normo-active BS + 4 quadrants, soft and non tender, no ascites GU: deferred MSK: mod  sarcopenia, moves all extremities, non ambulatory Skin: warm and dry, no rashes or wounds on visible skin Neuro:  +generalized weakness,  + cognitive impairment Psych: non-anxious affect, A and O x 1 Hem/lymph/immuno: no widespread bruising   Thank you for the opportunity to participate in the care of  [name].  The palliative care team will continue to follow. Please call our office at [phone] if we can be of additional assistance.   Jason Coop, NP DNP, AGPCNP-BC  COVID-19 PATIENT SCREENING TOOL Asked and negative response unless otherwise noted:   Have you had symptoms of covid, tested positive or been in  contact with someone with symptoms/positive test in the past 5-10 days?

## 2020-11-23 ENCOUNTER — Ambulatory Visit: Payer: Medicare PPO | Admitting: Podiatry

## 2021-02-03 ENCOUNTER — Other Ambulatory Visit: Payer: Self-pay

## 2021-02-03 ENCOUNTER — Other Ambulatory Visit: Payer: Medicare PPO | Admitting: Primary Care

## 2021-02-03 DIAGNOSIS — Z515 Encounter for palliative care: Secondary | ICD-10-CM

## 2021-02-03 DIAGNOSIS — F03918 Unspecified dementia, unspecified severity, with other behavioral disturbance: Secondary | ICD-10-CM

## 2021-02-03 NOTE — Progress Notes (Signed)
[facility] Palliative [facility] Consult Note Telephone: [phone]  Fax: [phone]    Date of encounter: 02/03/21 12:16 PM PATIENT NAME: Sara Bailey [address] [place] [address]   [phone] (home)  DOB: Feb 22, 1940 MRN: [id number] PRIMARY CARE PROVIDER:    [facility],  [place] [address] [phone]  REFERRING PROVIDER:   Alvester Morin, MD [phone] [address] [place],  [place] [address] [phone]  RESPONSIBLE PARTY:    Contact Information     Name Relation Home Work Mobile   Rogers,Ben Brother   [phone]   [place] Sister   [phone]       I met face to face with patient in [facility] facility. Palliative Care was asked to follow this patient by consultation request of Slade-Hartman, Ivette Loyal*  to address advance care planning and complex medical decision making. This is a follow up visit.                                   ASSESSMENT AND PLAN / RECOMMENDATIONS:   Advance Care Planning/Goals of Care: Goals include to maximize quality of life and symptom management. Our advance care planning conversation included a discussion about:     Experiences with loved ones who have been seriously ill or have died  Exploration of personal, cultural or spiritual beliefs that might influence medical decisions  Exploration of goals of care in the event of a sudden injury or illness  Called PR and discussed case. Brother and SIL voice concern over patient post covid. WE discussed possible decline or possible improvement over time.  CODE STATUS: DNR  Symptom Management/Plan:  Patient had covid infection and has been off isolation x 1 week. Family would like her oob if she can tolerate, dressed for the day. Today at lunch time she was still in bed. Her wt had declined 8lbs in 3-4 mos. Will continue to monitor for signs of decline. Pt alert and interactive  today.  Follow up Palliative Care Visit: Palliative care will continue to follow for complex medical decision making, advance care planning, and clarification of goals. Return 6 weeks or prn.  This visit was coded based on medical decision making (MDM).  PPS: 30%  HOSPICE ELIGIBILITY/DIAGNOSIS: TBD  Chief Complaint: debility  HISTORY OF PRESENT ILLNESS:  Sara Bailey is a 81 y.o. year old female  with dementia, recent covid infection, CKD, CAD, s/p cva .   History obtained from review of EMR, discussion with primary team, and interview with family, facility staff/caregiver and/or Ms. Burget.  I reviewed available labs, medications, imaging, studies and related documents from the EMR.  Records reviewed and summarized above.   ROS [facility]  General: NAD ENMT: denies dysphagia Pulmonary: denies cough, denies increased SOB Abdomen: endorses fair appetite, denies constipation, endorses incontinence of bowel GU: denies dysuria, endorses incontinence of urine MSK:  endorses weakness,  no falls reported Skin: denies rashes or wounds Neurological: denies pain, denies insomnia Psych: Endorses positive mood Heme/lymph/immuno: denies bruises, abnormal bleeding  Physical Exam: Current and past weights: Constitutional: NAD General: frail appearing, thin EYES: anicteric sclera, lids intact, no discharge  ENMT: intact hearing, oral mucous membranes moist CV: S1S2, RRR, no LE edema Pulmonary: LCTA, no increased work of breathing, no cough, room air Abdomen: intake 50-75%,  no ascites GU: deferred MSK: + sarcopenia, moves all extremities, non  ambulatory Skin: warm and  dry, no rashes or wounds on visible skin Neuro:  + generalized weakness,  + cognitive impairment Psych: non-anxious affect, A and O x 1 Hem/lymph/immuno: no widespread bruising  Outpatient Encounter Medications as of 02/03/2021  Medication Sig   atorvastatin (LIPITOR) 80 MG tablet Take 80 mg by mouth at bedtime.    cholecalciferol (VITAMIN D3) 25 MCG (1000 UNIT) tablet Take 1,000 Units by mouth daily.   donepezil (ARICEPT) 5 MG tablet Take 5 mg by mouth at bedtime.   levothyroxine (SYNTHROID) 75 MCG tablet Take 75 mcg by mouth daily before breakfast. Take on an empty stomach.   metoprolol succinate (TOPROL-XL) 25 MG 24 hr tablet Take 25 mg by mouth daily. (Hold if HR <50)   mirtazapine (REMERON) 7.5 MG tablet Take 7.5 mg by mouth at bedtime.   Multiple Vitamin (MULTIVITAMIN) capsule Take 1 capsule by mouth daily.   potassium chloride (KLOR-CON) 10 MEQ tablet Take 10 mEq by mouth daily.   senna (SENOKOT) 8.6 MG tablet Take 1 tablet by mouth 2 (two) times daily.   amLODipine (NORVASC) 10 MG tablet Take 1 tablet by mouth daily. (Patient not taking: Reported on 02/03/2021)   aspirin 81 MG chewable tablet Chew by mouth. (Patient not taking: Reported on 02/03/2021)   hydrochlorothiazide (HYDRODIURIL) 12.5 MG tablet Take 1 tablet by mouth daily. (Patient not taking: Reported on 02/03/2021)   latanoprost (XALATAN) 0.005 % ophthalmic solution Apply to eye. (Patient not taking: Reported on 02/03/2021)   [DISCONTINUED] atorvastatin (LIPITOR) 80 MG tablet Take 1 tablet by mouth daily.   No facility-administered encounter medications on file as of 02/03/2021.    Thank you for the opportunity to participate in the care of Ms. Pearce.  The palliative care team will continue to follow. Please call our office at [phone] if we can be of additional assistance.   Jason Coop, NP DNP, AGPCNP-BC  COVID-19 PATIENT SCREENING TOOL Asked and negative response unless otherwise noted:   Have you had symptoms of covid, tested positive or been in contact with someone with symptoms/positive test in the past 5-10 days?

## 2021-04-14 ENCOUNTER — Non-Acute Institutional Stay: Payer: Medicare PPO | Admitting: Primary Care

## 2021-04-14 ENCOUNTER — Other Ambulatory Visit: Payer: Self-pay

## 2021-04-14 DIAGNOSIS — F03918 Unspecified dementia, unspecified severity, with other behavioral disturbance: Secondary | ICD-10-CM

## 2021-04-14 DIAGNOSIS — I639 Cerebral infarction, unspecified: Secondary | ICD-10-CM

## 2021-04-14 DIAGNOSIS — Z515 Encounter for palliative care: Secondary | ICD-10-CM

## 2021-04-14 NOTE — Progress Notes (Signed)
? ? ?[facility] ?[facility] Palliative [facility] Consult Note ?Telephone: [phone]  ?Fax: [phone]  ? ? ?Date of encounter: 04/14/21 ?2:26 PM ?PATIENT NAME: Sara Bailey ?[address] ?[place] [address]   [phone] (home)  ?DOB: 15-Aug-1940 ?MRN: [id number] ?PRIMARY CARE PROVIDER:    ?Alvester Morin, MD,  ?[place]. ?[place] [address] ?[phone] ? ?REFERRING PROVIDER:   ?Alvester Morin, MD ?[address]. ?[place],  [place] [address] ?[phone] ? ?RESPONSIBLE PARTY:    ?Contact Information   ? ? Name Relation Home Work Mobile  ? Rogers,Ben Brother   [phone]  ? Rodgers,Christine Sister   [phone]  ? ?  ? ? ? ?I met face to face with patient  in [facility] facility. Palliative Care was asked to follow this patient by consultation request of  Slade-Hartman, Ivette Loyal* to address advance care planning and complex medical decision making. This is a follow up visit. ? ?                                 ASSESSMENT AND PLAN / RECOMMENDATIONS:  ? ?Advance Care Planning/Goals of Care: Goals include to maximize quality of life and symptom management. Patient/health care surrogate gave his/her permission to discuss.Our advance care planning conversation included a discussion about:    ? ?Exploration of personal, cultural or spiritual beliefs that might influence medical decisions  ?Exploration of goals of care RE in place treatments ?Identification of a healthcare agent - brother  ?Discussed  medical decision making impacting patient. ?CODE STATUS: DNR ? ?Symptom Management/Plan: ? ?I met with patient in her nursing home room. She is alert oriented to self only. 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 She is more interactive today than previous visits. Her lunch and breakfast trays are in the room her breakfast appears 75% eaten but she has not eaten any  lunch. I recommend that she is fed by staff  or at least cued to eat some thing at every meal. She does not appear to be in any discomfort and she is not agitated.  ? ?Dentures are not fitting and she has over growth of remaining teeth. Family is concerned RE surgery. Spoke with [employer]. ?Please monitor  weekly weights for intake tracking.  Please offer shakes between meals. Breakfast and lunch trays had shakes but she did not consume. She may also need cueing as she got distracted from her meal. Also suggest increasing remeron to 15 mg nightly. ? ?Follow up Palliative Care Visit: Palliative care will continue to follow for complex medical decision making, advance care planning, and clarification of goals. Return 6-8 weeks or prn. ? ?I spent 35 minutes providing this consultation. More than 50% of the time in this consultation was spent in counseling and care coordination. ? ?PPS: 30% ? ?HOSPICE ELIGIBILITY/DIAGNOSIS: TBD ? ?Chief Complaint: dementia ? ?HISTORY OF PRESENT ILLNESS:  Sara Bailey is a 81 y.o. year old female  with dementia and minor weight loss. Patient seen today to review palliative care needs to include medical decision making and advance care planning as appropriate.  ? ?History obtained from review of EMR, discussion with primary team, and interview with family, facility staff/caregiver and/or Ms. Agresti.  ?I reviewed available labs, medications, imaging, studies and related documents from the EMR.  Records reviewed and summarized above.  ? ?ROS/staff ? ?  General: NAD ?ENMT: denies dysphagia ?Pulmonary: denies cough, denies increased SOB ?Abdomen: endorses fair appetite, denies constipation, endorses incontinence of bowel ?GU: denies dysuria, endorses incontinence of urine ?MSK:  denies  increased weakness,  no falls reported ?Skin: denies rashes or wounds ?Neurological: denies pain, denies insomnia ?Psych: Endorses positive mood ? ?Physical Exam: ?Current and past weights: 128 lbs recently,  down from 132 lbs in 1/23 ?Constitutional: NAD ?General: frail appearing, thin ?EYES: anicteric sclera, lids intact, no discharge  ?ENMT: intact hearing, oral mucous membranes moist ?CV: S1S2, RRR, no LE edema ?Pulmonary: LCTA, no increased work of breathing, no cough, room air ?Abdomen: intake 50-75%, , no ascites ?MSK: +sarcopenia, moves all extremities, non  ambulatory ?Skin: warm and dry, no rashes or wounds on visible skin ?Neuro:  no new generalized weakness,  ++ cognitive impairment, non-anxious affect ? ?Outpatient Encounter Medications as of 04/14/2021  ?Medication Sig  ? atorvastatin (LIPITOR) 80 MG tablet Take 80 mg by mouth at bedtime.  ? cholecalciferol (VITAMIN D3) 25 MCG (1000 UNIT) tablet Take 1,000 Units by mouth daily.  ? divalproex (DEPAKOTE SPRINKLE) 125 MG capsule Take by mouth.  ? donepezil (ARICEPT) 5 MG tablet Take 10 mg by mouth at bedtime.  ? levothyroxine (SYNTHROID) 75 MCG tablet Take 75 mcg by mouth daily before breakfast. Take on an empty stomach.  ? metoprolol succinate (TOPROL-XL) 25 MG 24 hr tablet Take 25 mg by mouth daily. (Hold if HR <50)  ? mirtazapine (REMERON) 7.5 MG tablet Take 7.5 mg by mouth at bedtime.  ? Multiple Vitamin (MULTIVITAMIN) capsule Take 1 capsule by mouth daily.  ? potassium chloride (KLOR-CON) 10 MEQ tablet Take 10 mEq by mouth daily.  ? senna (SENOKOT) 8.6 MG tablet Take 1 tablet by mouth 2 (two) times daily.  ? amLODipine (NORVASC) 10 MG tablet Take 1 tablet by mouth daily. (Patient not taking: Reported on 02/03/2021)  ? aspirin 81 MG chewable tablet Chew by mouth. (Patient not taking: Reported on 02/03/2021)  ? hydrochlorothiazide (HYDRODIURIL) 12.5 MG tablet Take 1 tablet by mouth daily. (Patient not taking: Reported on 02/03/2021)  ? latanoprost (XALATAN) 0.005 % ophthalmic solution Apply to eye. (Patient not taking: Reported on 02/03/2021)  ? ?No facility-administered encounter medications on file as of 04/14/2021.  ? ? ?Thank you for the opportunity to  participate in the care of Ms. Been.  The palliative care team will continue to follow. Please call our office at [phone] if we can be of additional assistance.  ? ?Jason Coop, NP DNP, AGPCNP-BC ? ?COVID-19 PATIENT SCREENING TOOL ?Asked and negative response unless otherwise noted:  ? ?Have you had symptoms of covid, tested positive or been in contact with someone with symptoms/positive test in the past 5-10 days?  ? ?

## 2021-04-15 ENCOUNTER — Non-Acute Institutional Stay: Payer: Medicare PPO | Admitting: Primary Care

## 2021-07-06 ENCOUNTER — Non-Acute Institutional Stay: Payer: Medicare PPO | Admitting: Primary Care

## 2021-07-06 DIAGNOSIS — F03918 Unspecified dementia, unspecified severity, with other behavioral disturbance: Secondary | ICD-10-CM

## 2021-07-06 DIAGNOSIS — Z515 Encounter for palliative care: Secondary | ICD-10-CM

## 2021-07-06 NOTE — Progress Notes (Signed)
[facility] Consult Note Telephone: [phone]  Fax: [phone]    Date of encounter: 07/06/21 2:25 PM PATIENT NAME: Sara Bailey [address]  [place] [address]   [phone] (home)  DOB: 1940/03/16 MRN: [id number] PRIMARY CARE PROVIDER:    Alvester Morin, MD,  [place]. [place] [address] [phone]  REFERRING PROVIDER:   Alvester Morin, MD [address]. [place],  [place] [address] [phone]  RESPONSIBLE PARTY:    Contact Information     Name Relation Home Work Mobile   Rogers,Ben Brother   [phone]   [place] Sister   [phone]        I met face to face with patient in [facility] facility. Palliative Care was asked to follow this patient by consultation request of  Slade-Hartman, Ivette Loyal* to address advance care planning and complex medical decision making. This is a follow up visit.                                   ASSESSMENT AND PLAN / RECOMMENDATIONS:   Advance Care Planning/Goals of Care: Goals include to maximize quality of life and symptom management CODE STATUS: dnr  Symptom Management/Plan:  I met with patient in her nursing home room. She was sitting in her chair and stated she was doing well. Her weights  remain stable. She endorses a good appetite and states she's ready for lunch. Short term memory is very poor and she's a poor historian for recent events, but enjoys talking about her family of origin. Her sister lives in the nursing home as well, and she has a brother who visits. And she's continuing to be stable and enjoyed chatting with her new roommate. She also states she and her sister visit from time to time. She knows her sister is in room 12. I met with patient in her nursing home room. She had just returned from a shower. While being bundled up, she still complains of being very cold. I found her another blanket, but was unable to  adjust the heat.  She is perseverating on being cold and was not able to give me other information such as if she had pain and how her appetite was. However, on consulting the EMR, she has been showing a decline in weight of 10% since March 2023. The record shows that she has been refusing meals often  and must be fed by staff. She gets supplements, but sometimes refuses these. She is continuing to lose weight at a rate of several pounds per week. I would recommend to continue to weigh her twice a month or even weekly. However, if this pattern continues with her intake, not being able to be adjusted, I would recommend hospice with dx of  protein calorie malnutrition.   Goals of care are focused on quality of life and symptom management per advance care directive. I will reach out to discuss with the family if the skilled facility provider is an agreement I met with patient in her home.   Follow up Palliative Care Visit: Palliative care will continue to follow for complex medical decision making, advance care planning, and clarification of goals. Return 4 weeks or prn.   This visit was coded based on medical decision making (MDM).  PPS: 30%  HOSPICE ELIGIBILITY/DIAGNOSIS: TBD  Chief Complaint: weight loss  HISTORY OF PRESENT ILLNESS:  Sara Bailey is a 81 y.o.  year old female  with dementia weight loss . Patient seen today to review palliative care needs to include medical decision making and advance care planning as appropriate.   History obtained from review of EMR, discussion with primary team, and interview with family, facility staff/caregiver and/or Ms. Birchall.  I reviewed available labs, medications, imaging, studies and related documents from the EMR.  Records reviewed and summarized above.   ROS/staff   General: NAD ENMT: denies dysphagia Pulmonary: denies cough, denies increased SOB Abdomen: endorses poor appetite, denies constipation, endorses incontinence of bowel GU: denies  dysuria, endorses incontinence of urine MSK:  endorses  increased weakness,  no falls reported Skin: denies rashes or wounds Neurological: denies pain, denies insomnia Psych: Endorses withdrawn mood  Physical Exam: Current and past weights: 115 lbs, 10% loss in 3 months Constitutional: NAD General: frail appearing, thin EYES: anicteric sclera, lids intact, no discharge  ENMT: intact hearing, oral mucous membranes moist CV: no LE edema Pulmonary:  no increased work of breathing, no cough, room air Abdomen: intake <50%,  no ascites MSK: + sarcopenia, moves all extremities, non ambulatory Skin: warm and dry, no rashes or wounds on visible skin Neuro:  + generalized weakness,  + cognitive impairment, +anxious affect   Thank you for the opportunity to participate in the care of Sara Bailey.  The palliative care team will continue to follow. Please call our office at [phone] if we can be of additional assistance.   Jason Coop, NP DNP, AGPCNP-BC  COVID-19 PATIENT SCREENING TOOL Asked and negative response unless otherwise noted:   Have you had symptoms of covid, tested positive or been in contact with someone with symptoms/positive test in the past 5-10 days?

## 2021-08-09 ENCOUNTER — Other Ambulatory Visit: Payer: Self-pay

## 2021-08-09 ENCOUNTER — Emergency Department
Admission: EM | Admit: 2021-08-09 | Discharge: 2021-08-10 | Disposition: A | Discharge status: Home / Self Care | Payer: Medicare PPO | Attending: Emergency Medicine | Admitting: Emergency Medicine

## 2021-08-09 ENCOUNTER — Emergency Department: Payer: Medicare PPO

## 2021-08-09 DIAGNOSIS — I10 Essential (primary) hypertension: Secondary | ICD-10-CM | POA: Insufficient documentation

## 2021-08-09 DIAGNOSIS — F0390 Unspecified dementia without behavioral disturbance: Secondary | ICD-10-CM | POA: Diagnosis not present

## 2021-08-09 DIAGNOSIS — R4182 Altered mental status, unspecified: Secondary | ICD-10-CM | POA: Diagnosis present

## 2021-08-09 DIAGNOSIS — R404 Transient alteration of awareness: Secondary | ICD-10-CM

## 2021-08-09 DIAGNOSIS — E860 Dehydration: Secondary | ICD-10-CM

## 2021-08-09 MED ORDER — LACTATED RINGERS IV BOLUS
1000.0000 mL | Freq: Once | INTRAVENOUS | Status: AC
  Administered 2021-08-10: 1000 mL via INTRAVENOUS

## 2021-08-09 NOTE — ED Triage Notes (Signed)
Arrived via EMS from [facility]. Staff state they found patient slumped over in wheelchair, unresponsive with shallow respirations. Staff placed patient in bed and placed 2L per Home. Upon EMS arrival patient opens eyes to verbal stimuli and follows very simple commands. Staff report patient normally alert, oriented, however patient with history of alzheimers, dementia and aphasia. Upon arrival to ED, patient with spontaneous regular respirations and opens eyes to verbal stimuli.

## 2021-08-09 NOTE — ED Provider Notes (Signed)
[facility] Provider Note    Event Date/Time   First MD Initiated Contact with Patient 08/09/21 2026     (approximate)   History   Chief Complaint: Loss of Consciousness (Found slumped over in wheelchair unresponsive with shallow respirations by staff at [facility].)   HPI  Sara Bailey is a 81 y.o. female with a history of hypertension, stroke, dementia who is sent to the ED from her nursing facility tonight due to an episode of patient seeming unresponsive in her wheelchair today for about 2 hours in the middle of the afternoon.  They placed her on nasal cannula oxygen and called EMS.  Currently, patient is aphasic which is chronic.  She follows simple commands.  She does not indicate any acute pain, appears calm, and is resting comfortably.  Family at bedside deny noticing any acute changes in her health.  They note that she has poor oral intake chronically.     Physical Exam   Triage Vital Signs: ED Triage Vitals  Enc Vitals Group     BP 08/09/21 1958 112/74     Pulse Rate 08/09/21 1958 (!) 57     Resp 08/09/21 1958 14     Temp 08/09/21 1958 97.9 F (36.6 C)     Temp Source 08/09/21 1958 Oral     SpO2 08/09/21 1951 100 %     Weight 08/09/21 1958 117 lb 8.1 oz (53.3 kg)     Height 08/09/21 1958 5\' 7"  (1.702 m)     Head Circumference --      Peak Flow --      Pain Score --      Pain Loc --      Pain Edu? --      Excl. in GC? --     Most recent vital signs: Vitals:   08/09/21 2200 08/09/21 2300  BP: 127/68 114/60  Pulse: (!) 57 (!) 55  Resp: 13 15  Temp:    SpO2: 100% 100%    General: Awake, no distress.  Not oriented CV:  Good peripheral perfusion.  Regular rate rhythm. Resp:  Normal effort.  Clear to auscultation bilaterally Abd:  No distention.  Soft nontender Other:  No visible signs of trauma.  Full range of motion, no bony tenderness.  No lower extremity edema.  Dry mucous membranes.   ED Results / Procedures / Treatments    Labs (all labs ordered are listed, but only abnormal results are displayed) Labs Reviewed  BASIC METABOLIC PANEL  CBC  URINALYSIS, ROUTINE W REFLEX MICROSCOPIC  CBG MONITORING, ED     EKG Interpreted by me Sinus rhythm rate of 57.  Normal axis, right bundle branch block.  Mildly prolonged QTc of 504 ms.  Normal ST segments and T waves, no ischemic changes.   RADIOLOGY Chest x-ray interpreted by me, appears unremarkable without edema effusion or consolidation.  Radiology report reviewed.   PROCEDURES:  Procedures   MEDICATIONS ORDERED IN ED: Medications  lactated ringers bolus 1,000 mL (has no administration in time range)     IMPRESSION / MDM / ASSESSMENT AND PLAN / ED COURSE  I reviewed the triage vital signs and the nursing notes.                              Differential diagnosis includes, but is not limited to, baseline dementia, sleep, dehydration, AKI, electrolyte abnormality, cystitis, pneumonia  Patient's presentation is most consistent  with acute complicated illness / injury requiring diagnostic workup.  Patient sent to the ED due to an episode of altered mental status characterized by being unresponsive.  She appears to be at her chronic baseline currently.  Vital signs are normal.  Oxygenation is 100% on room air and she has normal breath sounds.  Due to her inability to provide any history or substantially participate in exam along with her comorbidities, work-up is necessary for screening purposes.  Will obtain labs and give IV fluids for hydration.  If work-up is reassuring I think patient can be discharged back to her nursing facility.    Clinical Course as of 08/10/21 0019  Mon Aug 09, 2021  2338 BP: 91/77 [PS]    Clinical Course User Index [PS] Sharman Cheek, MD     FINAL CLINICAL IMPRESSION(S) / ED DIAGNOSES   Final diagnoses:  Chronic dementia [facility])     Rx / DC Orders   ED Discharge Orders     None        Note:  This  document was prepared using Dragon voice recognition software and may include unintentional dictation errors.   Sharman Cheek, MD 08/10/21 [phone]

## 2021-08-10 ENCOUNTER — Emergency Department: Payer: Medicare PPO

## 2021-08-10 LAB — CBC
HCT: 40.9 % (ref 36.0–46.0)
Hemoglobin: 13.2 g/dL (ref 12.0–15.0)
MCH: 29 pg (ref 26.0–34.0)
MCHC: 32.3 g/dL (ref 30.0–36.0)
MCV: 89.9 fL (ref 80.0–100.0)
Platelets: 190 10*3/uL (ref 150–400)
RBC: 4.55 MIL/uL (ref 3.87–5.11)
RDW: 15.8 % — ABNORMAL HIGH (ref 11.5–15.5)
WBC: 11 10*3/uL — ABNORMAL HIGH (ref 4.0–10.5)
nRBC: 0 % (ref 0.0–0.2)

## 2021-08-10 LAB — URINALYSIS, ROUTINE W REFLEX MICROSCOPIC
Bilirubin Urine: NEGATIVE
Glucose, UA: NEGATIVE mg/dL
Hgb urine dipstick: NEGATIVE
Ketones, ur: 20 mg/dL — AB
Leukocytes,Ua: NEGATIVE
Nitrite: NEGATIVE
Protein, ur: 100 mg/dL — AB
Specific Gravity, Urine: 1.017 (ref 1.005–1.030)
pH: 5 (ref 5.0–8.0)

## 2021-08-10 LAB — BASIC METABOLIC PANEL
Anion gap: 11 (ref 5–15)
BUN: 28 mg/dL — ABNORMAL HIGH (ref 8–23)
CO2: 24 mmol/L (ref 22–32)
Calcium: 10.8 mg/dL — ABNORMAL HIGH (ref 8.9–10.3)
Chloride: 110 mmol/L (ref 98–111)
Creatinine, Ser: 1.45 mg/dL — ABNORMAL HIGH (ref 0.44–1.00)
GFR, Estimated: 36 mL/min — ABNORMAL LOW (ref >60)
Glucose, Bld: 93 mg/dL (ref 70–99)
Potassium: 4.8 mmol/L (ref 3.5–5.1)
Sodium: 145 mmol/L (ref 135–145)

## 2021-08-10 NOTE — ED Provider Notes (Signed)
-----------------------------------------   12:28 AM on 08/10/2021 -----------------------------------------  Assuming care from Dr. Scotty Court.  In short, Sara Bailey is a 81 y.o. female with a chief complaint of episode of alteration of awareness.  Refer to the original H&P for additional details.  The current plan of care is to monitor and follow-up on labs.    Clinical Course as of 08/10/21 0415  Mon Aug 09, 2021  2338 BP: 91/77 [PS]  Tue Aug 10, 2021  0058 Basic metabolic panel(!) No significant changes on basic metabolic panel.  She appears slightly dehydrated which Dr. Scotty Court suspected and she is receiving fluids already and has a creatinine of 1.45. [CF]  0409 The patient's work-up has been reassuring and she has been observed in the emergency department for 8-1/2 hours.  No acute abnormalities have been identified except for the probable mild dehydration which was addressed with IV fluids.  There is no indication that she has a urinary tract infection at this time; the urinalysis has only rare bacteria.  I sent a urine culture but there is no indication to treat bacteriuria in elderly woman without a strong indication to do so.  I viewed and interpreted her head CT and portable chest x-ray as well.  There is no indication of an acute or emergent abnormality on either study, including but not limited to pneumonia or intracranial bleeding.  The radiology reports agree with this assessment.  Given her overall appearance and an extended period of observation, she is appropriate for discharge back to her facility.  She has had a generally reassuring work-up and can follow-up with her regular doctor.  I considered hospitalization, but in the absence of any acute or emergent medical condition that can be treated in the hospital, it is most appropriate that she go back to her facility and follow-up as an outpatient. [CF]    Clinical Course User Index [CF] Loleta Rose, MD [PS] Sharman Cheek, MD     Loleta Rose, MD 08/10/21 [phone]

## 2021-08-10 NOTE — ED Notes (Signed)
Called EMS for transport back to [facility]

## 2021-08-10 NOTE — Discharge Instructions (Signed)
Sara Bailey extensive evaluation in the emergency department was generally reassuring.  There is no significant abnormality that requires hospitalization.  She might be a little bit dehydrated and we provided some IV fluids.  There is no indication of infection.  We recommend that she follow-up with her primary care doctor at the next available opportunity and continue on her regular medications.

## 2021-08-11 LAB — URINE CULTURE: Culture: NO GROWTH

## 2021-09-15 ENCOUNTER — Non-Acute Institutional Stay: Payer: Medicare PPO | Admitting: Primary Care

## 2021-09-15 DIAGNOSIS — Z515 Encounter for palliative care: Secondary | ICD-10-CM

## 2021-09-15 DIAGNOSIS — F03918 Unspecified dementia, unspecified severity, with other behavioral disturbance: Secondary | ICD-10-CM

## 2021-09-15 NOTE — Progress Notes (Signed)
[facility] Consult Note Telephone: [phone]  Fax: [phone]    Date of encounter: 09/15/21 10:50 AM PATIENT NAME: Sara Bailey [address] [place] [address]   [phone] (home)  DOB: 1940-03-01 MRN: [id number] PRIMARY CARE PROVIDER:    Alvester Morin, MD,  [address] [place] [address] [phone]  REFERRING PROVIDER:   Alvester Morin, MD [address] [place],  [place] [address] [phone]  RESPONSIBLE PARTY:    Contact Information     Name Relation Home Work Mobile   Rogers,Ben Brother   [phone]   [place] Sister   [phone]        I met face to face with patient in [facility] facility. Palliative Care was asked to follow this patient by consultation request of  Slade-Hartman, Ivette Loyal* to address advance care planning and complex medical decision making. This is a follow up visit.                                   ASSESSMENT AND PLAN / RECOMMENDATIONS:   Advance Care Planning/Goals of Care: Goals include to maximize quality of life and symptom management.  Identification of a healthcare agent -brother CODE STATUS: DNR  Symptom Management/Plan:  Patient states she is cold, blanket given. Staff reports no changes.   Mobility -She is sitting in w/c which she does for most of the day.   Nutrition: Her appetite and weight are stable, wt 115 lbs and intake 75-100%.   Pain: Voices no pain, just being cold.   Follow up Palliative Care Visit: Palliative care will continue to follow for complex medical decision making, advance care planning, and clarification of goals. Return 6-8 weeks or prn.  This visit was coded based on medical decision making (MDM).  PPS: 30%  HOSPICE ELIGIBILITY/DIAGNOSIS: TBD  Chief Complaint: Debility  HISTORY OF PRESENT ILLNESS:  Sara Bailey is a 81 y.o. year old female  with dementia, debility . Patient seen  today to review palliative care needs to include medical decision making and advance care planning as appropriate.   History obtained from review of EMR, discussion with primary team, and interview with family, facility staff/caregiver and/or Ms. Longbottom.  I reviewed available labs, medications, imaging, studies and related documents from the EMR.  Records reviewed and summarized above.   ROS [facility]  General: NAD ENT: No dysphagia Pulmonary: denies cough, denies increased SOB Abdomen: endorses good appetite, denies constipation, endorses incontinence of bowel GU: denies dysuria, endorses incontinence of urine MSK:  denies increased weakness,  no falls reported Skin: denies rashes or wounds Neurological: denies pain, denies insomnia Psych: Endorses withdrawn mood  Physical Exam: Current and past weights: 115 lbs Constitutional: NAD General: frail appearing, thin EYES: anicteric sclera, lids intact, no discharge  ENMT: intact hearing, oral mucous membranes moist CV:  no LE edema Pulmonary: no increased work of breathing, no cough, room air Abdomen: intake 100%, soft and non tender, no ascites MSK: + sarcopenia, moves all extremities, non ambulatory Skin: warm and dry, no rashes or wounds on visible skin Neuro:  + generalized weakness,  +  cognitive impairment, anxious affect   Thank you for the opportunity to participate in the care of Ms. Korver.  The palliative care team will continue to follow. Please call our office at [phone] if we can be of additional assistance.   Jason Coop, NP DNP, AGPCNP-BC  COVID-19 PATIENT SCREENING TOOL Asked and negative response unless otherwise noted:   Have you had symptoms of covid, tested positive or been in contact with someone with symptoms/positive test in the past 5-10 days?

## 2021-10-14 ENCOUNTER — Emergency Department: Payer: Medicare PPO

## 2021-10-14 ENCOUNTER — Inpatient Hospital Stay
Admission: EM | Admit: 2021-10-14 | Discharge: 2021-10-22 | DRG: 641 | Disposition: A | Discharge status: Skilled Nursing Facility | Payer: Medicare PPO | Attending: Internal Medicine | Admitting: Internal Medicine

## 2021-10-14 DIAGNOSIS — N390 Urinary tract infection, site not specified: Secondary | ICD-10-CM | POA: Diagnosis not present

## 2021-10-14 DIAGNOSIS — E162 Hypoglycemia, unspecified: Secondary | ICD-10-CM | POA: Diagnosis not present

## 2021-10-14 DIAGNOSIS — E440 Moderate protein-calorie malnutrition: Secondary | ICD-10-CM | POA: Diagnosis present

## 2021-10-14 DIAGNOSIS — F32A Depression, unspecified: Secondary | ICD-10-CM | POA: Diagnosis present

## 2021-10-14 DIAGNOSIS — N3000 Acute cystitis without hematuria: Secondary | ICD-10-CM | POA: Diagnosis present

## 2021-10-14 DIAGNOSIS — L8990 Pressure ulcer of unspecified site, unspecified stage: Secondary | ICD-10-CM | POA: Diagnosis present

## 2021-10-14 DIAGNOSIS — I10 Essential (primary) hypertension: Secondary | ICD-10-CM | POA: Diagnosis present

## 2021-10-14 DIAGNOSIS — Z87891 Personal history of nicotine dependence: Secondary | ICD-10-CM

## 2021-10-14 DIAGNOSIS — E872 Acidosis, unspecified: Secondary | ICD-10-CM | POA: Diagnosis present

## 2021-10-14 DIAGNOSIS — D649 Anemia, unspecified: Secondary | ICD-10-CM | POA: Diagnosis present

## 2021-10-14 DIAGNOSIS — I452 Bifascicular block: Secondary | ICD-10-CM | POA: Diagnosis present

## 2021-10-14 DIAGNOSIS — Z79890 Hormone replacement therapy (postmenopausal): Secondary | ICD-10-CM

## 2021-10-14 DIAGNOSIS — E039 Hypothyroidism, unspecified: Secondary | ICD-10-CM | POA: Diagnosis present

## 2021-10-14 DIAGNOSIS — F0153 Vascular dementia, unspecified severity, with mood disturbance: Secondary | ICD-10-CM | POA: Diagnosis present

## 2021-10-14 DIAGNOSIS — E876 Hypokalemia: Secondary | ICD-10-CM | POA: Diagnosis present

## 2021-10-14 DIAGNOSIS — R404 Transient alteration of awareness: Secondary | ICD-10-CM

## 2021-10-14 DIAGNOSIS — F01C3 Vascular dementia, severe, with mood disturbance: Secondary | ICD-10-CM | POA: Diagnosis present

## 2021-10-14 DIAGNOSIS — L89151 Pressure ulcer of sacral region, stage 1: Secondary | ICD-10-CM | POA: Diagnosis present

## 2021-10-14 DIAGNOSIS — N179 Acute kidney failure, unspecified: Secondary | ICD-10-CM | POA: Diagnosis present

## 2021-10-14 DIAGNOSIS — I2510 Atherosclerotic heart disease of native coronary artery without angina pectoris: Secondary | ICD-10-CM | POA: Diagnosis not present

## 2021-10-14 DIAGNOSIS — D696 Thrombocytopenia, unspecified: Secondary | ICD-10-CM | POA: Diagnosis present

## 2021-10-14 DIAGNOSIS — Z823 Family history of stroke: Secondary | ICD-10-CM

## 2021-10-14 DIAGNOSIS — Z515 Encounter for palliative care: Secondary | ICD-10-CM

## 2021-10-14 DIAGNOSIS — E870 Hyperosmolality and hypernatremia: Secondary | ICD-10-CM | POA: Diagnosis present

## 2021-10-14 DIAGNOSIS — Z8249 Family history of ischemic heart disease and other diseases of the circulatory system: Secondary | ICD-10-CM

## 2021-10-14 DIAGNOSIS — Z79899 Other long term (current) drug therapy: Secondary | ICD-10-CM

## 2021-10-14 DIAGNOSIS — R5381 Other malaise: Secondary | ICD-10-CM | POA: Diagnosis present

## 2021-10-14 DIAGNOSIS — Z8673 Personal history of transient ischemic attack (TIA), 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 residual deficits: Secondary | ICD-10-CM

## 2021-10-14 DIAGNOSIS — K5900 Constipation, unspecified: Secondary | ICD-10-CM | POA: Diagnosis present

## 2021-10-14 DIAGNOSIS — E860 Dehydration: Secondary | ICD-10-CM | POA: Diagnosis present

## 2021-10-14 DIAGNOSIS — Z66 Do not resuscitate: Secondary | ICD-10-CM | POA: Diagnosis present

## 2021-10-14 HISTORY — DX: Urinary tract infection, site not specified: N39.0

## 2021-10-14 LAB — COMPREHENSIVE METABOLIC PANEL
ALT: 16 U/L (ref 0–44)
AST: 19 U/L (ref 15–41)
Albumin: 2.7 g/dL — ABNORMAL LOW (ref 3.5–5.0)
Alkaline Phosphatase: 52 U/L (ref 38–126)
Anion gap: 5 (ref 5–15)
BUN: 27 mg/dL — ABNORMAL HIGH (ref 8–23)
CO2: 30 mmol/L (ref 22–32)
Calcium: 9.9 mg/dL (ref 8.9–10.3)
Chloride: 118 mmol/L — ABNORMAL HIGH (ref 98–111)
Creatinine, Ser: 1.35 mg/dL — ABNORMAL HIGH (ref 0.44–1.00)
GFR, Estimated: 39 mL/min — ABNORMAL LOW (ref >60)
Glucose, Bld: 342 mg/dL — ABNORMAL HIGH (ref 70–99)
Potassium: 4.1 mmol/L (ref 3.5–5.1)
Sodium: 153 mmol/L — ABNORMAL HIGH (ref 135–145)
Total Bilirubin: 0.3 mg/dL (ref 0.3–1.2)
Total Protein: 5.9 g/dL — ABNORMAL LOW (ref 6.5–8.1)

## 2021-10-14 LAB — CBG MONITORING, ED
Glucose-Capillary: 17 mg/dL — CL (ref 70–99)
Glucose-Capillary: 216 mg/dL — ABNORMAL HIGH (ref 70–99)
Glucose-Capillary: 305 mg/dL — ABNORMAL HIGH (ref 70–99)
Glucose-Capillary: 32 mg/dL — CL (ref 70–99)
Glucose-Capillary: 36 mg/dL — CL (ref 70–99)
Glucose-Capillary: 58 mg/dL — ABNORMAL LOW (ref 70–99)

## 2021-10-14 LAB — CBC WITH DIFFERENTIAL/PLATELET
Abs Immature Granulocytes: 0.01 10*3/uL (ref 0.00–0.07)
Basophils Absolute: 0 10*3/uL (ref 0.0–0.1)
Basophils Relative: 0 %
Eosinophils Absolute: 0 10*3/uL (ref 0.0–0.5)
Eosinophils Relative: 1 %
HCT: 36.1 % (ref 36.0–46.0)
Hemoglobin: 11.1 g/dL — ABNORMAL LOW (ref 12.0–15.0)
Immature Granulocytes: 0 %
Lymphocytes Relative: 47 %
Lymphs Abs: 2.7 10*3/uL (ref 0.7–4.0)
MCH: 29.8 pg (ref 26.0–34.0)
MCHC: 30.7 g/dL (ref 30.0–36.0)
MCV: 97 fL (ref 80.0–100.0)
Monocytes Absolute: 0.2 10*3/uL (ref 0.1–1.0)
Monocytes Relative: 3 %
Neutro Abs: 2.8 10*3/uL (ref 1.7–7.7)
Neutrophils Relative %: 49 %
Platelets: 135 10*3/uL — ABNORMAL LOW (ref 150–400)
RBC: 3.72 MIL/uL — ABNORMAL LOW (ref 3.87–5.11)
RDW: 18 % — ABNORMAL HIGH (ref 11.5–15.5)
WBC: 5.7 10*3/uL (ref 4.0–10.5)
nRBC: 0 % (ref 0.0–0.2)

## 2021-10-14 LAB — BASIC METABOLIC PANEL
Anion gap: 5 (ref 5–15)
BUN: 27 mg/dL — ABNORMAL HIGH (ref 8–23)
CO2: 30 mmol/L (ref 22–32)
Calcium: 9.9 mg/dL (ref 8.9–10.3)
Chloride: 118 mmol/L — ABNORMAL HIGH (ref 98–111)
Creatinine, Ser: 1.33 mg/dL — ABNORMAL HIGH (ref 0.44–1.00)
GFR, Estimated: 40 mL/min — ABNORMAL LOW (ref >60)
Glucose, Bld: 234 mg/dL — ABNORMAL HIGH (ref 70–99)
Potassium: 4 mmol/L (ref 3.5–5.1)
Sodium: 153 mmol/L — ABNORMAL HIGH (ref 135–145)

## 2021-10-14 LAB — URINALYSIS, ROUTINE W REFLEX MICROSCOPIC
Bilirubin Urine: NEGATIVE
Glucose, UA: >=500 mg/dL — AB
Hgb urine dipstick: NEGATIVE
Ketones, ur: NEGATIVE mg/dL
Nitrite: NEGATIVE
Protein, ur: NEGATIVE mg/dL
Specific Gravity, Urine: 1.023 (ref 1.005–1.030)
pH: 5 (ref 5.0–8.0)

## 2021-10-14 LAB — LACTIC ACID, PLASMA
Lactic Acid, Venous: 2.9 mmol/L (ref 0.5–1.9)
Lactic Acid, Venous: 3 mmol/L (ref 0.5–1.9)

## 2021-10-14 LAB — TSH: TSH: 1.916 u[IU]/mL (ref 0.350–4.500)

## 2021-10-14 LAB — GLUCOSE, CAPILLARY: Glucose-Capillary: 49 mg/dL — ABNORMAL LOW (ref 70–99)

## 2021-10-14 LAB — VALPROIC ACID LEVEL: Valproic Acid Lvl: 46 ug/mL — ABNORMAL LOW (ref 50.0–100.0)

## 2021-10-14 LAB — TROPONIN I (HIGH SENSITIVITY)
Troponin I (High Sensitivity): 14 ng/L (ref <18)
Troponin I (High Sensitivity): 15 ng/L (ref <18)

## 2021-10-14 LAB — PROCALCITONIN: Procalcitonin: <0.1 ng/mL

## 2021-10-14 MED ORDER — ENOXAPARIN SODIUM 30 MG/0.3ML IJ SOSY
30.0000 mg | PREFILLED_SYRINGE | INTRAMUSCULAR | Status: DC
  Administered 2021-10-14: 30 mg via SUBCUTANEOUS
  Filled 2021-10-14 (×2): qty 0.3

## 2021-10-14 MED ORDER — VANCOMYCIN HCL IN DEXTROSE 1-5 GM/200ML-% IV SOLN
1000.0000 mg | Freq: Once | INTRAVENOUS | Status: AC
  Administered 2021-10-14: 1000 mg via INTRAVENOUS
  Filled 2021-10-14: qty 200

## 2021-10-14 MED ORDER — FLUOXETINE HCL 10 MG PO CAPS
10.0000 mg | ORAL_CAPSULE | Freq: Every day | ORAL | Status: DC
  Administered 2021-10-15 – 2021-10-22 (×6): 10 mg via ORAL
  Filled 2021-10-14 (×10): qty 1

## 2021-10-14 MED ORDER — METOPROLOL SUCCINATE ER 25 MG PO TB24: 25.0000 mg | ORAL_TABLET | Freq: Every evening | ORAL | Status: DC

## 2021-10-14 MED ORDER — DEXTROSE 50 % IV SOLN
INTRAVENOUS | Status: AC
  Administered 2021-10-14: 50 mL via INTRAVENOUS
  Filled 2021-10-14: qty 100

## 2021-10-14 MED ORDER — ACETAMINOPHEN 325 MG PO TABS
650.0000 mg | ORAL_TABLET | Freq: Four times a day (QID) | ORAL | Status: DC | PRN
  Administered 2021-10-17: 650 mg via ORAL
  Filled 2021-10-14: qty 2

## 2021-10-14 MED ORDER — SODIUM CHLORIDE 0.9 % IV BOLUS
1000.0000 mL | Freq: Once | INTRAVENOUS | Status: AC
  Administered 2021-10-14: 1000 mL via INTRAVENOUS

## 2021-10-14 MED ORDER — DEXTROSE-NACL 5-0.9 % IV SOLN: INTRAVENOUS | Status: DC

## 2021-10-14 MED ORDER — DEXTROSE 50 % IV SOLN
INTRAVENOUS | Status: AC
  Filled 2021-10-14: qty 50

## 2021-10-14 MED ORDER — DIVALPROEX SODIUM 125 MG PO CSDR
250.0000 mg | DELAYED_RELEASE_CAPSULE | Freq: Every day | ORAL | Status: DC
  Administered 2021-10-14 – 2021-10-20 (×6): 250 mg via ORAL
  Filled 2021-10-14 (×8): qty 2

## 2021-10-14 MED ORDER — DEXTROSE 10 % IV SOLN: INTRAVENOUS | Status: DC

## 2021-10-14 MED ORDER — DEXTROSE-NACL 10-0.45 % IV SOLN
INTRAVENOUS | Status: DC
  Filled 2021-10-14: qty 1000

## 2021-10-14 MED ORDER — DONEPEZIL HCL 5 MG PO TABS
10.0000 mg | ORAL_TABLET | Freq: Every day | ORAL | Status: DC
  Administered 2021-10-14 – 2021-10-20 (×6): 10 mg via ORAL
  Filled 2021-10-14 (×8): qty 2

## 2021-10-14 MED ORDER — ONDANSETRON HCL 4 MG/2ML IJ SOLN: 4.0000 mg | Freq: Four times a day (QID) | INTRAMUSCULAR | Status: DC | PRN

## 2021-10-14 MED ORDER — DEXTROSE 50 % IV SOLN
25.0000 mL | Freq: Once | INTRAVENOUS | Status: AC
  Administered 2021-10-14: 25 mL via INTRAVENOUS

## 2021-10-14 MED ORDER — SENNOSIDES-DOCUSATE SODIUM 8.6-50 MG PO TABS
1.0000 | ORAL_TABLET | Freq: Two times a day (BID) | ORAL | Status: DC
  Administered 2021-10-14 – 2021-10-22 (×12): 1 via ORAL
  Filled 2021-10-14 (×14): qty 1

## 2021-10-14 MED ORDER — DEXTROSE 50 % IV SOLN
1.0000 | INTRAVENOUS | Status: AC
  Administered 2021-10-14: 50 mL via INTRAVENOUS

## 2021-10-14 MED ORDER — ACETAMINOPHEN 650 MG RE SUPP: 650.0000 mg | Freq: Four times a day (QID) | RECTAL | Status: DC | PRN

## 2021-10-14 MED ORDER — MAGNESIUM HYDROXIDE 400 MG/5ML PO SUSP: 30.0000 mL | Freq: Every day | ORAL | Status: DC | PRN

## 2021-10-14 MED ORDER — LORAZEPAM 0.5 MG PO TABS: 0.5000 mg | ORAL_TABLET | Freq: Every day | ORAL | Status: DC | PRN

## 2021-10-14 MED ORDER — TRAZODONE HCL 50 MG PO TABS: 25.0000 mg | ORAL_TABLET | Freq: Every evening | ORAL | Status: DC | PRN

## 2021-10-14 MED ORDER — MEMANTINE HCL 5 MG PO TABS
10.0000 mg | ORAL_TABLET | Freq: Two times a day (BID) | ORAL | Status: DC
  Administered 2021-10-14 – 2021-10-22 (×13): 10 mg via ORAL
  Filled 2021-10-14 (×15): qty 2

## 2021-10-14 MED ORDER — DIVALPROEX SODIUM 125 MG PO CSDR
125.0000 mg | DELAYED_RELEASE_CAPSULE | Freq: Every morning | ORAL | Status: DC
  Administered 2021-10-15 – 2021-10-19 (×5): 125 mg via ORAL
  Filled 2021-10-14 (×8): qty 1

## 2021-10-14 MED ORDER — DEXTROSE 50 % IV SOLN: 1.0000 | Freq: Once | INTRAVENOUS | Status: AC

## 2021-10-14 MED ORDER — SODIUM CHLORIDE 0.9 % IV SOLN
2.0000 g | Freq: Once | INTRAVENOUS | Status: AC
  Administered 2021-10-14: 2 g via INTRAVENOUS
  Filled 2021-10-14: qty 12.5

## 2021-10-14 MED ORDER — ATORVASTATIN CALCIUM 80 MG PO TABS
80.0000 mg | ORAL_TABLET | Freq: Every day | ORAL | Status: DC
  Administered 2021-10-14 – 2021-10-20 (×6): 80 mg via ORAL
  Filled 2021-10-14 (×7): qty 1

## 2021-10-14 MED ORDER — ONDANSETRON HCL 4 MG PO TABS: 4.0000 mg | ORAL_TABLET | Freq: Four times a day (QID) | ORAL | Status: DC | PRN

## 2021-10-14 MED ORDER — MIRTAZAPINE 15 MG PO TABS
15.0000 mg | ORAL_TABLET | Freq: Every day | ORAL | Status: DC
  Administered 2021-10-14 – 2021-10-20 (×6): 15 mg via ORAL
  Filled 2021-10-14 (×7): qty 1

## 2021-10-14 NOTE — Progress Notes (Signed)
PHARMACIST - PHYSICIAN COMMUNICATION  CONCERNING:  Enoxaparin (Lovenox) for DVT Prophylaxis    RECOMMENDATION: Patient was prescribed enoxaprin 40mg  q24 hours for VTE prophylaxis.   Filed Weights   10/14/21 1200  Weight: 53.1 kg (117 lb 1 oz)    Body mass index is 18.33 kg/m.  Estimated Creatinine Clearance: 26.9 mL/min (A) (by C-G formula based on SCr of 1.33 mg/dL (H)).   Patient is candidate for enoxaparin 30mg  every 24 hours based on CrCl <44ml/min or Weight <45kg  DESCRIPTION: Pharmacy has adjusted enoxaparin dose per [facility] policy.  Patient is now receiving enoxaparin 30 mg every 24 hours    31m, PharmD, BCPS Clinical Pharmacist 10/14/2021 1:51 PM

## 2021-10-14 NOTE — Assessment & Plan Note (Signed)
Patient with no agitation, continue with divalproex, donepezil, and fluoxetine.  Continue with memantine and mirtazapine.  As needed lorazepam.

## 2021-10-14 NOTE — Assessment & Plan Note (Signed)
Patient has completed antibiotic therapy with good toleration.

## 2021-10-14 NOTE — ED Notes (Addendum)
Finger stick CBG 32. Blood glucose sent to lab at same time. Both taken prior to 1/2 amp of dextrose being administered. Lab made aware to run second BMP sent

## 2021-10-14 NOTE — ED Notes (Signed)
This RN, Consulting civil engineer and EDP at bedside attempting IV access

## 2021-10-14 NOTE — ED Notes (Signed)
EDT attempting to assist RN in 2nd in and out attempt. Unsuccessful at this time. MD made aware.

## 2021-10-14 NOTE — H&P (Addendum)
[facility]   PATIENT NAME: Sara Bailey    MR#:  [id number]  DATE OF BIRTH:  1940/04/07  DATE OF ADMISSION:  10/14/2021  PRIMARY CARE PHYSICIAN: Keane Police, MD   Patient is coming from: [facility]  REQUESTING/REFERRING PHYSICIAN: Shaune Pollack, MD  CHIEF COMPLAINT:   Chief Complaint  Patient presents with   Altered Mental Status   Hypoglycemia    HISTORY OF PRESENT ILLNESS:  Michille Mcelrath is a 81 y.o. African-American female with medical history significant for coronary artery disease, hypertension and CVA, who presented to the emergency room with acute onset of decreased responsiveness at her [facility] skilled nursing facility where she resides.  On Monday she was thought to require oxygen then.  She was thought to be dry and was placed on IV hydration.  Today she got significantly less responsive and was noted to be hypoglycemic before she was sent to the ER.  No reported dysuria, oliguria or hematuria or flank pain.  No reported cough or wheezing or dyspnea.  No reported chest pain or palpitations.  The patient is a poor historian due to dementia.  ED Course: When the patient came to the ER, vital signs were within normal except for which has been ranging from 42-48.  Bradycardia of 42.  BMP revealed mild hyponatremia 153 and hyperkalemia of 118 a blood glucose of 234 and a BUN of 27 and creatinine 1.33 which are close to previous levels.  Lactic acid was 2.9 and later on 3..  CBC showed hemoglobin of 11.1 with hematocrit 36.1.  TSH was 1.91.   EKG as reviewed by me :  EKG showed sinus bradycardia with rate 42 with right bundle branch block and LVH with IVCD and prolonged QT interval with QTc of 522 MS Imaging: Portable chest x-ray showed no acute cardiopulmonary disease. Noncontrasted CT scan revealed no acute intracranial pathology.  The patient was given 2 g of IV Rocephin p.m. as well as 1 and half amp of D50 and 1 L bolus of IV normal saline followed by  D5 normal saline at 75 mill per hour.  She will be admitted to a progressive unit bed for further evaluation and management. PAST MEDICAL HISTORY:   Past Medical History:  Diagnosis Date   Coronary artery disease    Hypertension    Stroke [facility])     PAST SURGICAL HISTORY:  No past surgical history on file.  No reported previous surgeries.  SOCIAL HISTORY:   Social History   Tobacco Use   Smoking status: Former    Types: Cigarettes    Quit date: 2019    Years since quitting: 4.7   Smokeless tobacco: Never  Substance Use Topics   Alcohol use: Never    FAMILY HISTORY:   Family History  Problem Relation Age of Onset   Stroke Father    Heart attack Brother     DRUG ALLERGIES:  No Known Allergies  REVIEW OF SYSTEMS:   ROS As per history of present illness. All pertinent systems were reviewed above. Constitutional, HEENT, cardiovascular, respiratory, GI, GU, musculoskeletal, neuro, psychiatric, endocrine, integumentary and hematologic systems were reviewed and are otherwise negative/unremarkable except for positive findings mentioned above in the HPI.   MEDICATIONS AT HOME:   Prior to Admission medications   Medication Sig Start Date End Date Taking? Authorizing Provider  atorvastatin (LIPITOR) 80 MG tablet Take 80 mg by mouth at bedtime.   Yes [provider]  cholecalciferol (VITAMIN D3) 25  MCG (1000 UNIT) tablet Take 1,000 Units by mouth daily.   Yes [provider]  divalproex (DEPAKOTE SPRINKLE) 125 MG capsule Take 125 mg by mouth. 125 mg in the morning and 250 mg in the evening 03/26/21  Yes [provider]  donepezil (ARICEPT) 5 MG tablet Take 10 mg by mouth at bedtime.   Yes [provider]  dorzolamide (TRUSOPT) 2 % ophthalmic solution Place 1 drop into both eyes 3 (three) times daily. 10/13/21  Yes [provider]  FLUoxetine (PROZAC) 10 MG capsule Take 10 mg by mouth daily. 09/10/21  Yes [provider]   latanoprost (XALATAN) 0.005 % ophthalmic solution Apply to eye. 01/12/18  Yes [provider]  levothyroxine (SYNTHROID) 75 MCG tablet Take 75 mcg by mouth daily before breakfast. Take on an empty stomach. 01/11/20  Yes [provider]  LORazepam (ATIVAN) 0.5 MG tablet Take 0.5 mg by mouth daily as needed. 09/10/21  Yes [provider]  memantine (NAMENDA) 10 MG tablet Take 10 mg by mouth 2 (two) times daily. 10/12/21  Yes [provider]  metoprolol succinate (TOPROL-XL) 25 MG 24 hr tablet Take 25 mg by mouth daily. (Hold if HR <50) 07/09/20  Yes [provider]  mirtazapine (REMERON) 15 MG tablet Take 15 mg by mouth at bedtime. 09/30/21  Yes [provider]  Multiple Vitamin (MULTIVITAMIN) capsule Take 1 capsule by mouth daily.   Yes [provider]  potassium chloride (KLOR-CON) 10 MEQ tablet Take 10 mEq by mouth daily. 06/22/20  Yes [provider]  SENNA-S 8.6-50 MG tablet Take 1 tablet by mouth 2 (two) times daily. 04/20/21  Yes [provider]  SIMBRINZA 1-0.2 % SUSP Place 1 drop into both eyes 3 (three) times daily. 10/13/21  Yes [provider]  amLODipine (NORVASC) 10 MG tablet Take 1 tablet by mouth daily. Patient not taking: Reported on 02/03/2021 07/16/17   [provider]  aspirin 81 MG chewable tablet Chew by mouth. Patient not taking: Reported on 02/03/2021 07/14/17   [provider]  hydrochlorothiazide (HYDRODIURIL) 12.5 MG tablet Take 1 tablet by mouth daily. Patient not taking: Reported on 02/03/2021 07/14/17   [provider]  mirtazapine (REMERON) 7.5 MG tablet Take 7.5 mg by mouth at bedtime. Patient not taking: Reported on 10/14/2021    [provider]  senna (SENOKOT) 8.6 MG tablet Take 1 tablet by mouth 2 (two) times daily. Patient not taking: Reported on 10/14/2021    [provider]      VITAL SIGNS:  Blood pressure 128/66, pulse (!) 47, temperature  97.9 F (36.6 C), temperature source Axillary, resp. rate 12, weight 53.1 kg, SpO2 100 %.  PHYSICAL EXAMINATION:  Physical Exam  GENERAL:  81 y.o.-year-old African female patient lying in the bed with no acute distress.  EYES: Pupils equal, round, reactive to light and accommodation. No scleral icterus. Extraocular muscles intact.  HEENT: Head atraumatic, normocephalic. Oropharynx and nasopharynx clear.  NECK:  Supple, no jugular venous distention. No thyroid enlargement, no tenderness.  LUNGS: Normal breath sounds bilaterally, no wheezing, rales,rhonchi or crepitation. No use of accessory muscles of respiration.  CARDIOVASCULAR: Regular rate and rhythm, S1, S2 normal. No murmurs, rubs, or gallops.  ABDOMEN: Soft, nondistended, nontender. Bowel sounds present. No organomegaly or mass.  EXTREMITIES: No pedal edema, cyanosis, or clubbing.  NEUROLOGIC: Cranial nerves II through XII are intact. Muscle strength 5/5 in all extremities. Sensation intact. Gait not checked.  PSYCHIATRIC: The patient is alert  and oriented x 3.  Normal affect and good eye contact. SKIN: No obvious rash, lesion, or ulcer.   LABORATORY PANEL:   CBC Recent Labs  Lab 10/14/21 0811  WBC 5.7  HGB 11.1*  HCT 36.1  PLT 135*   ------------------------------------------------------------------------------------------------------------------  Chemistries  Recent Labs  Lab 10/14/21 0811 10/14/21 0918  NA 153* 153*  K 4.1 4.0  CL 118* 118*  CO2 30 30  GLUCOSE 342* 234*  BUN 27* 27*  CREATININE 1.35* 1.33*  CALCIUM 9.9 9.9  AST 19  --   ALT 16  --   ALKPHOS 52  --   BILITOT 0.3  --    ------------------------------------------------------------------------------------------------------------------  Cardiac Enzymes No results for input(s): "TROPONINI" in the last 168 hours. ------------------------------------------------------------------------------------------------------------------  RADIOLOGY:  CT  HEAD WO CONTRAST ( )  Result Date: 10/14/2021 CLINICAL DATA:  Altered mental status for 2-3 days. EXAM: CT HEAD WITHOUT CONTRAST TECHNIQUE: Contiguous axial images were obtained from the base of the skull through the vertex without intravenous contrast. RADIATION DOSE REDUCTION: This exam was performed according to the departmental dose-optimization program which includes automated exposure control, adjustment of the mA and/or kV according to patient size and/or use of iterative reconstruction technique. COMPARISON:  Head CT 08/10/2021 FINDINGS: Brain: There is no acute intracranial hemorrhage, extra-axial fluid collection, or acute infarct. Background parenchymal volume loss with prominence of the ventricular system and extra-axial CSF spaces is unchanged. Confluent hypodensity in the supratentorial white matter likely reflecting sequela of advanced chronic small vessel ischemic change is stable. 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 There is no mass lesion.  There is no mass effect or midline shift. Vascular: There is calcification of the bilateral carotid siphons and vertebral arteries. Skull: Normal. Negative for fracture or focal lesion. Sinuses/Orbits: The imaged paranasal sinuses are clear. The imaged globes and orbits are unremarkable. Other: None. IMPRESSION: Stable noncontrast head CT with no acute intracranial pathology. Electronically Signed   By: Lesia Hausen M.D.   On: 10/14/2021 11:00   DG Chest Portable 1 View  Result Date: 10/14/2021 CLINICAL DATA:  Altered mental status, now less responsive EXAM: PORTABLE CHEST 1 VIEW COMPARISON:  07/10/2020 chest radiograph FINDINGS: No pleural effusion. No pneumothorax. Unchanged cardiac and mediastinal contours. No focal airspace opacity. No acute osseous abnormality. Visualized upper abdomen is unremarkable. There are mild calcifications of the aortic arch. IMPRESSION: No focal airspace opacity Electronically Signed   By: Lorenza Cambridge  M.D.   On: 10/14/2021 08:53      IMPRESSION AND PLAN:  Assessment and Plan: * Hypoglycemia - The patient will be admitted to a progressive unit observation bed. - We will continue monitoring her blood glucose and continue D5 normal saline at 100 mill per hour. - We will continue monitoring her mental status that is currently improved.  Acute lower UTI - The patient will be placed on IV Rocephin and follow urine culture and sensitivity. - We will follow lactic acid level.  She does not meet sepsis criteria at this point.  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HCC) - We will continue Aricept and Prozac as well as Depakote  Hypothyroidism - We will continue Synthroid  Essential hypertension - We will continue her antihypertensives  Coronary artery disease - We will continue statin therapy, aspirin and beta-blocker therapy.    DVT prophylaxis: Lovenox.  Advanced Care Planning:  Code Status: The patient is DNR/DNI. Family Communication:  The plan of care was discussed in details with the patient (and family).  I answered all questions. The patient agreed to proceed with the above mentioned plan. Further management will depend upon hospital course. Disposition Plan: Back to previous home environment Consults called: none.  All the records are reviewed and case discussed with ED provider.  Status is: Observation  I certify that at the time of admission, it is my clinical judgment that the patient will require  hospital care extending last than 2 midnights.                            Dispo: The patient is from: [facility]              Anticipated d/c is to: [facility]              Patient currently is not medically stable to d/c.              Difficult to place patient: No  Hannah Beat M.D on 10/14/2021 at 3:51 PM  [facility]   From 7 PM-7 AM, contact night-coverage [email or web address]  CC: Primary care physician; Keane Police, MD

## 2021-10-14 NOTE — ED Provider Notes (Signed)
[facility] Provider Note    None    (approximate)   History   Altered Mental Status and Hypoglycemia   HPI  Sara Bailey is a 81 y.o. female here with altered mental status.  Per report, the patient is normally combative.  She was found to be minimally responsive this morning.  She has a history of severe dementia, most forms in place.  On EMS arrival, patient has been minimally responsive.  They did note a blood sugar in the 60s.  Blood glucose is 17 on arrival here.  Patient unable to provide further history.     Physical Exam   Triage Vital Signs: ED Triage Vitals  Enc Vitals Group     BP      Pulse      Resp      Temp      Temp src      SpO2      Weight      Height      Head Circumference      Peak Flow      Pain Score      Pain Loc      Pain Edu?      Excl. in GC?     Most recent vital signs: Vitals:   10/14/21 1500 10/14/21 1515  BP: (!) 141/66 128/66  Pulse: (!) 48 (!) 47  Resp: 14 12  Temp: 97.9 F (36.6 C)   SpO2: 100% 100%     General: Drowsy, minimally responsive.  Does appear to withdraw from painful stimuli. CV:  Good peripheral perfusion.  Mild bradycardia. Resp:  Normal effort.  Lungs clear. Abd:  No distention.  No tenderness. Other:  Severely demented, essentially nonverbal.  Does appear to withdraw from stimuli bilateral upper and lower extremities.   ED Results / Procedures / Treatments   Labs (all labs ordered are listed, but only abnormal results are displayed) Labs Reviewed  CBC WITH DIFFERENTIAL/PLATELET - Abnormal; Notable for the following components:      Result Value   RBC 3.72 (*)    Hemoglobin 11.1 (*)    RDW 18.0 (*)    Platelets 135 (*)    All other components within normal limits  COMPREHENSIVE METABOLIC PANEL - Abnormal; Notable for the following components:   Sodium 153 (*)    Chloride 118 (*)    Glucose, Bld 342 (*)    BUN 27 (*)    Creatinine, Ser 1.35 (*)    Total Protein 5.9  (*)    Albumin 2.7 (*)    GFR, Estimated 39 (*)    All other components within normal limits  LACTIC ACID, PLASMA - Abnormal; Notable for the following components:   Lactic Acid, Venous 2.9 (*)    All other components within normal limits  LACTIC ACID, PLASMA - Abnormal; Notable for the following components:   Lactic Acid, Venous 3.0 (*)    All other components within normal limits  URINALYSIS, ROUTINE W REFLEX MICROSCOPIC - Abnormal; Notable for the following components:   Color, Urine YELLOW (*)    APPearance HAZY (*)    Glucose, UA >=500 (*)    Leukocytes,Ua SMALL (*)    Bacteria, UA RARE (*)    All other components within normal limits  VALPROIC ACID LEVEL - Abnormal; Notable for the following components:   Valproic Acid Lvl 46 (*)    All other components within normal limits  BASIC METABOLIC PANEL - Abnormal; Notable for  the following components:   Sodium 153 (*)    Chloride 118 (*)    Glucose, Bld 234 (*)    BUN 27 (*)    Creatinine, Ser 1.33 (*)    GFR, Estimated 40 (*)    All other components within normal limits  CBG MONITORING, ED - Abnormal; Notable for the following components:   Glucose-Capillary 17 (*)    All other components within normal limits  CBG MONITORING, ED - Abnormal; Notable for the following components:   Glucose-Capillary 305 (*)    All other components within normal limits  CBG MONITORING, ED - Abnormal; Notable for the following components:   Glucose-Capillary 36 (*)    All other components within normal limits  CBG MONITORING, ED - Abnormal; Notable for the following components:   Glucose-Capillary 216 (*)    All other components within normal limits  CBG MONITORING, ED - Abnormal; Notable for the following components:   Glucose-Capillary 32 (*)    All other components within normal limits  CBG MONITORING, ED - Abnormal; Notable for the following components:   Glucose-Capillary 58 (*)    All other components within normal limits  CULTURE,  BLOOD (SINGLE)  PROCALCITONIN  TSH  TROPONIN I (HIGH SENSITIVITY)  TROPONIN I (HIGH SENSITIVITY)     EKG Sinus bradycardia, ventricular rate 42.  PR 153, QRS 1.5, QTc 522.  No acute ST elevations or depressions.  No EKG evidence of acute ischemia or infarct.   RADIOLOGY Chest x-ray: No focal opacity   I also independently reviewed and agree with radiologist interpretations.   PROCEDURES:  Critical Care performed: No  .Critical Care  Performed by: Shaune Pollack, MD Authorized by: Shaune Pollack, MD   Critical care provider statement:    Critical care time (minutes):  30   Critical care time was exclusive of:  Separately billable procedures and treating other patients   Critical care was necessary to treat or prevent imminent or life-threatening deterioration of the following conditions:  Cardiac failure, circulatory failure and respiratory failure   Critical care was time spent personally by me on the following activities:  Development of treatment plan with patient or surrogate, discussions with consultants, evaluation of patient's response to treatment, examination of patient, ordering and review of laboratory studies, ordering and review of radiographic studies, ordering and performing treatments and interventions, pulse oximetry, re-evaluation of patient's condition and review of old charts Ultrasound ED Peripheral IV (Provider)  Date/Time: 10/14/2021 3:47 PM  Performed by: Shaune Pollack, MD Authorized by: Shaune Pollack, MD   Procedure details:    Indications: multiple failed IV attempts     Skin Prep: chlorhexidine gluconate     Location:  Left anterior forearm   Angiocath:  20 G   Bedside Ultrasound Guided: Yes     Images: not archived     Patient tolerated procedure without complications: Yes     Dressing applied: Yes      MEDICATIONS ORDERED IN ED: Medications  dextrose 5 %-0.9 % sodium chloride infusion (0 mLs Intravenous Stopped 10/14/21 1207)   atorvastatin (LIPITOR) tablet 80 mg (has no administration in time range)  metoprolol succinate (TOPROL-XL) 24 hr tablet 25 mg (has no administration in time range)  donepezil (ARICEPT) tablet 10 mg (has no administration in time range)  FLUoxetine (PROZAC) capsule 10 mg (10 mg Oral Not Given 10/14/21 1413)  LORazepam (ATIVAN) tablet 0.5 mg (has no administration in time range)  mirtazapine (REMERON) tablet 15 mg (has no administration in time  range)  memantine (NAMENDA) tablet 10 mg (has no administration in time range)  enoxaparin (LOVENOX) injection 30 mg (has no administration in time range)  dextrose 5 %-0.9 % sodium chloride infusion ( Intravenous New Bag/Given 10/14/21 1411)  acetaminophen (TYLENOL) tablet 650 mg (has no administration in time range)    Or  acetaminophen (TYLENOL) suppository 650 mg (has no administration in time range)  traZODone (DESYREL) tablet 25 mg (has no administration in time range)  magnesium hydroxide (MILK OF MAGNESIA) suspension 30 mL (has no administration in time range)  ondansetron (ZOFRAN) tablet 4 mg (has no administration in time range)    Or  ondansetron (ZOFRAN) injection 4 mg (has no administration in time range)  dextrose 50 % solution 50 mL (50 mLs Intravenous Given 10/14/21 0822)  dextrose 50 % solution 25 mL (25 mLs Intravenous Given 10/14/21 0925)  sodium chloride 0.9 % bolus 1,000 mL (0 mLs Intravenous Stopped 10/14/21 1133)  vancomycin (VANCOCIN) IVPB 1000 mg/200 mL premix (0 mg Intravenous Stopped 10/14/21 1532)  ceFEPIme (MAXIPIME) 2 g in sodium chloride 0.9 % 100 mL IVPB (0 g Intravenous Stopped 10/14/21 1358)  sodium chloride 0.9 % bolus 1,000 mL (1,000 mLs Intravenous New Bag/Given 10/14/21 1346)     IMPRESSION / MDM / ASSESSMENT AND PLAN / ED COURSE  I reviewed the triage vital signs and the nursing notes.                               The patient is on the cardiac monitor to evaluate for evidence of arrhythmia and/or significant  heart rate changes.   Ddx:  Differential includes the following, with pertinent life- or limb-threatening emergencies considered:  AMS 2/2 UTI, PNA, metabolic encephalopathy such as AKI, med effect, seizures, stroke, worsening dementia/delirium  Patient's presentation is most consistent with acute presentation with potential threat to life or bodily function.  MDM:  81 yo F here with AMS, hypoglycemia. Unclear degree of hypoglycemia as her BMP showed hyperglycemia but fingerstick was low - likely from hypoperfusion. Pt is o/w HDS.  Peripheral IV placed with dextrose.  She remained hyperglycemic after this although again, somewhat unclear as this could be related to hypoperfusion.  Second dose given and will place on D5.  Lab work reviewed.  Patient has no significant leukocytosis.  CMP is consistent with dehydration with likely hyponatremia related to poor p.o. intake.  Her glucose is normal on her CMP.  Lactic acid elevated at 2.9 and repeat at 3.  Unclear as she has had elevated lactic acid in the past, no known liver disease.  However, will treat this as possible sepsis with IV fluids and antibiotics.  Urinalysis does show possible UTI.  Discussed the case with the family.  They are in agreement with medical treatment.  She is otherwise DNR/DNI.   MEDICATIONS GIVEN IN ED: Medications  dextrose 5 %-0.9 % sodium chloride infusion (0 mLs Intravenous Stopped 10/14/21 1207)  atorvastatin (LIPITOR) tablet 80 mg (has no administration in time range)  metoprolol succinate (TOPROL-XL) 24 hr tablet 25 mg (has no administration in time range)  donepezil (ARICEPT) tablet 10 mg (has no administration in time range)  FLUoxetine (PROZAC) capsule 10 mg (10 mg Oral Not Given 10/14/21 1413)  LORazepam (ATIVAN) tablet 0.5 mg (has no administration in time range)  mirtazapine (REMERON) tablet 15 mg (has no administration in time range)  memantine (NAMENDA) tablet 10 mg (has no administration in time  range)   enoxaparin (LOVENOX) injection 30 mg (has no administration in time range)  dextrose 5 %-0.9 % sodium chloride infusion ( Intravenous New Bag/Given 10/14/21 1411)  acetaminophen (TYLENOL) tablet 650 mg (has no administration in time range)    Or  acetaminophen (TYLENOL) suppository 650 mg (has no administration in time range)  traZODone (DESYREL) tablet 25 mg (has no administration in time range)  magnesium hydroxide (MILK OF MAGNESIA) suspension 30 mL (has no administration in time range)  ondansetron (ZOFRAN) tablet 4 mg (has no administration in time range)    Or  ondansetron (ZOFRAN) injection 4 mg (has no administration in time range)  dextrose 50 % solution 50 mL (50 mLs Intravenous Given 10/14/21 0822)  dextrose 50 % solution 25 mL (25 mLs Intravenous Given 10/14/21 0925)  sodium chloride 0.9 % bolus 1,000 mL (0 mLs Intravenous Stopped 10/14/21 1133)  vancomycin (VANCOCIN) IVPB 1000 mg/200 mL premix (0 mg Intravenous Stopped 10/14/21 1532)  ceFEPIme (MAXIPIME) 2 g in sodium chloride 0.9 % 100 mL IVPB (0 g Intravenous Stopped 10/14/21 1358)  sodium chloride 0.9 % bolus 1,000 mL (1,000 mLs Intravenous New Bag/Given 10/14/21 1346)     Consults:  Hospitalist   EMR reviewed       FINAL CLINICAL IMPRESSION(S) / ED DIAGNOSES   Final diagnoses:  Hypoglycemia  Acute cystitis without hematuria  Transient alteration of awareness     Rx / DC Orders   ED Discharge Orders     None        Note:  This document was prepared using Dragon voice recognition software and may include unintentional dictation errors.   Shaune Pollack, MD 10/14/21 1547

## 2021-10-14 NOTE — ED Notes (Signed)
Family at bedside. MD made aware.

## 2021-10-14 NOTE — Assessment & Plan Note (Addendum)
CBG's improved on D5W infusion, but patient has ongoing poor and minimal PO intake.     Patient was taken off D5W to monitor for recurrent hypoglycemic episodes - sugars have been 70's to 80's.  SLP for swallow evaluation - dysphagia 1 (pureed) diet. Aspiration precautions  Palliative consulted given underlying dementia which seems advanced.   --Pt is now on comfort care --No further CBG's needed

## 2021-10-14 NOTE — ED Notes (Signed)
Pt transported to CT ?

## 2021-10-14 NOTE — ED Triage Notes (Signed)
Pt to ED via [facility] from [facility]. Facility states mental decline x2-3 days. Pt has hx of dementia and is combative at baseline but has become less responsive. On EMS arrival pt responsive to painful stimuli only. EMS unable to get accurate oxygen reading and placed pt on NRB.   CBG on arrival 58. Pt minimally responsive to painful stimuli. Fingers appear gray. Oxygen reading on ear 97%. Pt placed on baseline oxygen of 4L [place]. DNR/Most from present with pt.   EMS VS: HR 40s SB CBG 69 BP 106/50

## 2021-10-14 NOTE — Assessment & Plan Note (Addendum)
D/c levothyroxine, on comfort care

## 2021-10-14 NOTE — ED Notes (Addendum)
Pt finger stick reading 36 and RN took another sample from new IV line reading 216. MD made aware of conflictign results

## 2021-10-14 NOTE — ED Notes (Addendum)
RN attempted in and out cath without success. Purewick placed on pt. MD made aware

## 2021-10-14 NOTE — Assessment & Plan Note (Addendum)
D/c aspirin and atorvastatin, on comfort care. Patient with no signs chest pain.

## 2021-10-14 NOTE — Consult Note (Signed)
PHARMACY -  BRIEF ANTIBIOTIC NOTE   Pharmacy has received consult(s) for Vancomycin from an ED provider.  The patient's profile has been reviewed for ht/wt/allergies/indication/available labs.    One time order(s) placed for Cefepime 2g IV x1 and Vancomycin 1000 mg IV x1  Further antibiotics/pharmacy consults should be ordered by admitting physician if indicated.                       Thank you,  Celene Squibb, PharmD PGY1 Pharmacy Resident 10/14/2021 12:51 PM

## 2021-10-14 NOTE — ED Notes (Signed)
Repeat CBG 305

## 2021-10-14 NOTE — Assessment & Plan Note (Addendum)
We held off antihypertensives due to risk of hypotension. Meds discontinued.

## 2021-10-14 NOTE — ED Notes (Signed)
Pt's family remains at bedside; educated about need for I&O cath for urine sample.

## 2021-10-15 DIAGNOSIS — E876 Hypokalemia: Secondary | ICD-10-CM | POA: Diagnosis present

## 2021-10-15 DIAGNOSIS — D649 Anemia, unspecified: Secondary | ICD-10-CM | POA: Diagnosis present

## 2021-10-15 DIAGNOSIS — F0153 Vascular dementia, unspecified severity, with mood disturbance: Secondary | ICD-10-CM | POA: Diagnosis not present

## 2021-10-15 DIAGNOSIS — F32A Depression, unspecified: Secondary | ICD-10-CM | POA: Diagnosis present

## 2021-10-15 DIAGNOSIS — E440 Moderate protein-calorie malnutrition: Secondary | ICD-10-CM | POA: Diagnosis present

## 2021-10-15 DIAGNOSIS — N390 Urinary tract infection, site not specified: Secondary | ICD-10-CM | POA: Diagnosis not present

## 2021-10-15 DIAGNOSIS — E872 Acidosis, unspecified: Secondary | ICD-10-CM | POA: Diagnosis present

## 2021-10-15 DIAGNOSIS — E162 Hypoglycemia, unspecified: Secondary | ICD-10-CM | POA: Diagnosis present

## 2021-10-15 DIAGNOSIS — E870 Hyperosmolality and hypernatremia: Secondary | ICD-10-CM

## 2021-10-15 DIAGNOSIS — R627 Adult failure to thrive: Secondary | ICD-10-CM | POA: Diagnosis not present

## 2021-10-15 DIAGNOSIS — Z66 Do not resuscitate: Secondary | ICD-10-CM | POA: Diagnosis present

## 2021-10-15 DIAGNOSIS — E860 Dehydration: Secondary | ICD-10-CM | POA: Diagnosis present

## 2021-10-15 DIAGNOSIS — F01C3 Vascular dementia, severe, with mood disturbance: Secondary | ICD-10-CM | POA: Diagnosis present

## 2021-10-15 DIAGNOSIS — I2510 Atherosclerotic heart disease of native coronary artery without angina pectoris: Secondary | ICD-10-CM | POA: Diagnosis present

## 2021-10-15 DIAGNOSIS — I452 Bifascicular block: Secondary | ICD-10-CM | POA: Diagnosis present

## 2021-10-15 DIAGNOSIS — Z79890 Hormone replacement therapy (postmenopausal): Secondary | ICD-10-CM | POA: Diagnosis not present

## 2021-10-15 DIAGNOSIS — Z87891 Personal history of nicotine dependence: Secondary | ICD-10-CM | POA: Diagnosis not present

## 2021-10-15 DIAGNOSIS — N3000 Acute cystitis without hematuria: Secondary | ICD-10-CM | POA: Diagnosis present

## 2021-10-15 DIAGNOSIS — Z7189 Other specified counseling: Secondary | ICD-10-CM | POA: Diagnosis not present

## 2021-10-15 DIAGNOSIS — Z8249 Family history of ischemic heart disease and other diseases of the circulatory system: Secondary | ICD-10-CM | POA: Diagnosis not present

## 2021-10-15 DIAGNOSIS — N179 Acute kidney failure, unspecified: Secondary | ICD-10-CM | POA: Diagnosis present

## 2021-10-15 DIAGNOSIS — I10 Essential (primary) hypertension: Secondary | ICD-10-CM | POA: Diagnosis present

## 2021-10-15 DIAGNOSIS — Z823 Family history of stroke: Secondary | ICD-10-CM | POA: Diagnosis not present

## 2021-10-15 DIAGNOSIS — Z8673 Personal history of transient ischemic attack (TIA), 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 residual deficits: Secondary | ICD-10-CM | POA: Diagnosis not present

## 2021-10-15 DIAGNOSIS — L89151 Pressure ulcer of sacral region, stage 1: Secondary | ICD-10-CM | POA: Diagnosis present

## 2021-10-15 DIAGNOSIS — D696 Thrombocytopenia, unspecified: Secondary | ICD-10-CM | POA: Diagnosis present

## 2021-10-15 DIAGNOSIS — E039 Hypothyroidism, unspecified: Secondary | ICD-10-CM | POA: Diagnosis present

## 2021-10-15 DIAGNOSIS — Z515 Encounter for palliative care: Secondary | ICD-10-CM | POA: Diagnosis not present

## 2021-10-15 HISTORY — DX: Hyperosmolality and hypernatremia: E87.0

## 2021-10-15 LAB — BASIC METABOLIC PANEL
Anion gap: 5 (ref 5–15)
BUN: 16 mg/dL (ref 8–23)
CO2: 26 mmol/L (ref 22–32)
Calcium: 9 mg/dL (ref 8.9–10.3)
Chloride: 124 mmol/L — ABNORMAL HIGH (ref 98–111)
Creatinine, Ser: 0.94 mg/dL (ref 0.44–1.00)
GFR, Estimated: >60 mL/min (ref >60)
Glucose, Bld: 157 mg/dL — ABNORMAL HIGH (ref 70–99)
Potassium: 3.4 mmol/L — ABNORMAL LOW (ref 3.5–5.1)
Sodium: 155 mmol/L — ABNORMAL HIGH (ref 135–145)

## 2021-10-15 LAB — SODIUM
Sodium: 149 mmol/L — ABNORMAL HIGH (ref 135–145)
Sodium: 147 mmol/L — ABNORMAL HIGH (ref 135–145)
Sodium: 147 mmol/L — ABNORMAL HIGH (ref 135–145)
Sodium: 144 mmol/L (ref 135–145)
Sodium: 145 mmol/L (ref 135–145)

## 2021-10-15 LAB — GLUCOSE, CAPILLARY
Glucose-Capillary: 109 mg/dL — ABNORMAL HIGH (ref 70–99)
Glucose-Capillary: 151 mg/dL — ABNORMAL HIGH (ref 70–99)
Glucose-Capillary: 21 mg/dL — CL (ref 70–99)
Glucose-Capillary: 53 mg/dL — ABNORMAL LOW (ref 70–99)
Glucose-Capillary: 65 mg/dL — ABNORMAL LOW (ref 70–99)
Glucose-Capillary: 65 mg/dL — ABNORMAL LOW (ref 70–99)
Glucose-Capillary: 74 mg/dL (ref 70–99)
Glucose-Capillary: 77 mg/dL (ref 70–99)
Glucose-Capillary: 81 mg/dL (ref 70–99)
Glucose-Capillary: 82 mg/dL (ref 70–99)
Glucose-Capillary: 93 mg/dL (ref 70–99)
Glucose-Capillary: 94 mg/dL (ref 70–99)
Glucose-Capillary: 94 mg/dL (ref 70–99)

## 2021-10-15 LAB — CBC
HCT: 35.1 % — ABNORMAL LOW (ref 36.0–46.0)
Hemoglobin: 10.5 g/dL — ABNORMAL LOW (ref 12.0–15.0)
MCH: 29.4 pg (ref 26.0–34.0)
MCHC: 29.9 g/dL — ABNORMAL LOW (ref 30.0–36.0)
MCV: 98.3 fL (ref 80.0–100.0)
Platelets: 112 10*3/uL — ABNORMAL LOW (ref 150–400)
RBC: 3.57 MIL/uL — ABNORMAL LOW (ref 3.87–5.11)
RDW: 18 % — ABNORMAL HIGH (ref 11.5–15.5)
WBC: 6.1 10*3/uL (ref 4.0–10.5)
nRBC: 0 % (ref 0.0–0.2)

## 2021-10-15 LAB — GLUCOSE, RANDOM
Glucose, Bld: 147 mg/dL — ABNORMAL HIGH (ref 70–99)
Glucose, Bld: 96 mg/dL (ref 70–99)
Glucose, Bld: 108 mg/dL — ABNORMAL HIGH (ref 70–99)
Glucose, Bld: 110 mg/dL — ABNORMAL HIGH (ref 70–99)
Glucose, Bld: 124 mg/dL — ABNORMAL HIGH (ref 70–99)

## 2021-10-15 MED ORDER — DEXTROSE 50 % IV SOLN
INTRAVENOUS | Status: AC
  Filled 2021-10-15: qty 50

## 2021-10-15 MED ORDER — ENSURE ENLIVE PO LIQD
237.0000 mL | Freq: Two times a day (BID) | ORAL | Status: DC
  Administered 2021-10-15 – 2021-10-20 (×8): 237 mL via ORAL

## 2021-10-15 MED ORDER — DEXTROSE 50 % IV SOLN
25.0000 g | INTRAVENOUS | Status: AC
  Administered 2021-10-15: 25 g via INTRAVENOUS

## 2021-10-15 MED ORDER — SODIUM CHLORIDE 0.9 % IV SOLN
1.0000 g | INTRAVENOUS | Status: DC
  Administered 2021-10-15 – 2021-10-18 (×4): 1 g via INTRAVENOUS
  Filled 2021-10-15 (×4): qty 10

## 2021-10-15 MED ORDER — DEXTROSE 50 % IV SOLN: 12.5000 g | INTRAVENOUS | Status: AC

## 2021-10-15 MED ORDER — POTASSIUM CHLORIDE 20 MEQ PO PACK
40.0000 meq | PACK | Freq: Once | ORAL | Status: DC
  Filled 2021-10-15: qty 2

## 2021-10-15 MED ORDER — DEXTROSE 5 % IV SOLN: INTRAVENOUS | Status: DC

## 2021-10-15 MED ORDER — DEXTROSE 50 % IV SOLN
12.5000 g | INTRAVENOUS | Status: AC
  Administered 2021-10-15: 12.5 g via INTRAVENOUS

## 2021-10-15 MED ORDER — ENOXAPARIN SODIUM 40 MG/0.4ML IJ SOSY
40.0000 mg | PREFILLED_SYRINGE | INTRAMUSCULAR | Status: DC
  Administered 2021-10-15 – 2021-10-20 (×6): 40 mg via SUBCUTANEOUS
  Filled 2021-10-15 (×6): qty 0.4

## 2021-10-15 NOTE — Progress Notes (Signed)
Patient admitted to the unit and placed on telemetry. A skin assessment was completed by myself and Retail banker. The patient has a small knot on the side of the right side of her head, bruising to the lower extremities, a stage 1 on the sacrum, scattered bruising to the arms and legs. No other skin issues are noted at this time.

## 2021-10-15 NOTE — Progress Notes (Signed)
PROGRESS NOTE  Sara Bailey  GEX:[id number] DOB: 12/22/1940 DOA: 10/14/2021 PCP: Keane Police, MD   Brief Narrative: Patient is a 81 year old African-American female with history of coronary artery disease, hypertension, CVA who presented here with complaint of confusion from a skilled nursing facility.  She was noted to be hypoglycemic at her facility.  Patient is a poor historian due to history of dementia.  On presentation ,she was hemodynamically stable with mild sinus bradycardia.  Labs showed sodium of 153, creatinine of 1.3, lactic acid of 2.9.  Chest x-ray did not show any acute cardiopulmonary disease.  CT head was negative for acute intracranial findings.  UA was suspicious for UTI with leukocytes.  Patient was started on ceftriaxone, gentle IV fluids and admitted for further management.  Assessment & Plan:  Principal Problem:   Hypoglycemia Active Problems:   Acute lower UTI   Coronary artery disease   Essential hypertension   Hypothyroidism   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HCC)   Hypernatremia  Altered mental status: Unclear etiology but hypoglycemia ,hypernatremia could have contributed along with concurrent UTI.  CT head did not show any acute intracranial abnormality.  Patient has history of dementia. Continue delirium precautions, frequent reorientation.  Hypoglycemic episode: Monitor blood sugars, currently on D5.  Avoid hypoglycemic agents  AKI/hypernatremia/ hypokalemia: Could be from dehydration.  Continue gentle hypotonic fluids.  Monitor BMP.  AKI has resolved. Potassium supplemented  UTI: UA suspicious for UTI.  Not clear if patient had dysuria.  Started on ceftriaxone.  Follow-up urine culture  Dementia/depression: On Aricept, Prozac,rameron, Depakote.  Continue supportive care  Hypothyroidism: Continue Synthyroid  Hypertension: Currently blood pressure stable.  Continue current medications  Coronary artery  disease/bradycardia: No  anginal symptoms.  On  statin, aspirin, beta-blocker therapy.  Will hold metoprolol due to bradycardia.  Prolonged Qtc: QTc of 522. Improved to 448 today  Debility/deconditioning: Patient is from skilled nursing facility.  TOC consulted.  Plan is to discharge back to skilled nursing facility when stable      DVT prophylaxis:enoxaparin (LOVENOX) injection 30 mg Start: 10/14/21 2200     Code Status: DNR  Family Communication: Called brother Romeo Apple on phone today, call not received  Patient status:Obs  Patient is from :SNF  Anticipated discharge to:SNF  Estimated DC date:2-3 days   Consultants: None  Procedures: None  Antimicrobials:  Anti-infectives (From admission, onward)    Start     Dose/Rate Route Frequency Ordered Stop   10/14/21 1300  vancomycin (VANCOCIN) IVPB 1000 mg/200 mL premix        1,000 mg 200 mL/hr over 60 Minutes Intravenous  Once 10/14/21 1252 10/14/21 1532   10/14/21 1300  ceFEPIme (MAXIPIME) 2 g in sodium chloride 0.9 % 100 mL IVPB        2 g 200 mL/hr over 30 Minutes Intravenous  Once 10/14/21 1252 10/14/21 1358       Subjective: Patient seen and examined at the bedside today.  Hemodynamically stable.  During my evaluation, she was lying in bed, on supplemental oxygen.  She does not look in any kind of distress.  Opened her eyes on calling her name but did not talk.  Objective: Vitals:   10/14/21 1959 10/14/21 2004 10/14/21 2259 10/15/21 0400  BP: 120/79  (!) 153/75 (!) 141/67  Pulse: 99  (!) 53 97  Resp: 20  18 20   Temp: 98.6 F (37 C)  98.1 F (36.7 C) 98.3 F (36.8 C)  TempSrc: Oral     SpO2: )  86%  90% (!) 79%  Weight:  48.8 kg    Height:  5\' 7"  (1.702 m)      Intake/Output Summary (Last 24 hours) at 10/15/2021 0750 Last data filed at 10/15/2021 0526 Gross per 24 hour  Intake 635.24 ml  Output --  Net 635.24 ml   Filed Weights   10/14/21 1200 10/14/21 2004  Weight: 53.1 kg 48.8 kg    Examination:  General exam: Overall  comfortable, not in distress, deconditioned, weak, lying in bed HEENT: PERRL Respiratory system:  no wheezes or crackles  Cardiovascular system: S1 & S2 heard, RRR.  Gastrointestinal system: Abdomen is nondistended, soft and nontender. Central nervous system: Alert and awake but not oriented Extremities: No edema, no clubbing ,no cyanosis Skin: No rashes, no ulcers,no icterus     Data Reviewed: I have personally reviewed following labs and imaging studies  CBC: Recent Labs  Lab 10/14/21 0811 10/15/21 0335  WBC 5.7 6.1  NEUTROABS 2.8  --   HGB 11.1* 10.5*  HCT 36.1 35.1*  MCV 97.0 98.3  PLT 135* 112*   Basic Metabolic Panel: Recent Labs  Lab 10/14/21 0811 10/14/21 0918 10/15/21 0335  NA 153* 153* 155*  K 4.1 4.0 3.4*  CL 118* 118* 124*  CO2 30 30 26   GLUCOSE 342* 234* 157*  BUN 27* 27* 16  CREATININE 1.35* 1.33* 0.94  CALCIUM 9.9 9.9 9.0     Recent Results (from the past 240 hour(s))  Blood culture (single)     Status: None (Preliminary result)   Collection Time: 10/14/21 10:20 AM   Specimen: BLOOD  Result Value Ref Range Status   Specimen Description BLOOD RIGHT ARM  Final   Special Requests   Final    BOTTLES DRAWN AEROBIC AND ANAEROBIC Blood Culture adequate volume   Culture   Final    NO GROWTH < 24 HOURS Performed at [facility], [address], [place], [address] [place]    Report Status PENDING  Incomplete     Radiology Studies: CT HEAD WO CONTRAST ([place])  Result Date: 10/14/2021 CLINICAL DATA:  Altered mental status for 2-3 days. EXAM: CT HEAD WITHOUT CONTRAST TECHNIQUE: Contiguous axial images were obtained from the base of the skull through the vertex without intravenous contrast. RADIATION DOSE REDUCTION: This exam was performed according to the departmental dose-optimization program which includes automated exposure control, adjustment of the mA and/or kV according to patient size and/or use of iterative reconstruction technique.  COMPARISON:  Head CT 08/10/2021 FINDINGS: Brain: There is no acute intracranial hemorrhage, extra-axial fluid collection, or acute infarct. Background parenchymal volume loss with prominence of the ventricular system and extra-axial CSF spaces is unchanged. Confluent hypodensity in the supratentorial white matter likely reflecting sequela of advanced chronic small vessel ischemic change is stable. 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 There is no mass lesion.  There is no mass effect or midline shift. Vascular: There is calcification of the bilateral carotid siphons and vertebral arteries. Skull: Normal. Negative for fracture or focal lesion. Sinuses/Orbits: The imaged paranasal sinuses are clear. The imaged globes and orbits are unremarkable. Other: None. IMPRESSION: Stable noncontrast head CT with no acute intracranial pathology. Electronically Signed   By: 10/16/2021 M.D.   On: 10/14/2021 11:00   DG Chest Portable 1 View  Result Date: 10/14/2021 CLINICAL DATA:  Altered mental status, now less responsive EXAM: PORTABLE CHEST 1 VIEW COMPARISON:  07/10/2020 chest radiograph FINDINGS: No pleural effusion. No pneumothorax. Unchanged cardiac  and mediastinal contours. No focal airspace opacity. No acute osseous abnormality. Visualized upper abdomen is unremarkable. There are mild calcifications of the aortic arch. IMPRESSION: No focal airspace opacity Electronically Signed   By: Lorenza Cambridge M.D.   On: 10/14/2021 08:53    Scheduled Meds:  atorvastatin  80 mg Oral QHS   dextrose       divalproex  125 mg Oral q morning   divalproex  250 mg Oral QHS   donepezil  10 mg Oral QHS   enoxaparin (LOVENOX) injection  30 mg Subcutaneous Q24H   FLUoxetine  10 mg Oral Daily   memantine  10 mg Oral BID   metoprolol succinate  25 mg Oral QPM   mirtazapine  15 mg Oral QHS   senna-docusate  1 tablet Oral BID   Continuous Infusions:  dextrose 150 mL/hr at 10/15/21 0526     LOS: 0 days   Burnadette Pop, MD [facility] P9/15/2023, 7:50 AM

## 2021-10-15 NOTE — Progress Notes (Addendum)
   PATIENT NAME: Sara Bailey    MR#:  [id number]  DATE OF BIRTH:  10-19-1940    Cross Cover Data  Nurse reports hypoglycemic event of 49 Amp D50 ordered Review of labs shows sodium level on admit 153 at 0918 am on 9/14. IV fluids currently ordered D5NS at 100 ml/h Prolonged QtTc on Ekgs   Plan  Stat sodium level now and every 4hours Start D5W at 100  Continue to monitor on tele and avoid AT prolonging meds  Donnie Mesa NP [facility]

## 2021-10-15 NOTE — Progress Notes (Signed)
PHARMACIST - PHYSICIAN COMMUNICATION  CONCERNING:  Enoxaparin (Lovenox) for DVT Prophylaxis    RECOMMENDATION: Patient was prescribed enoxaprin 30mg  q24 hours for VTE prophylaxis.   Filed Weights   10/14/21 1200 10/14/21 2004  Weight: 53.1 kg (117 lb 1 oz) 48.8 kg (107 lb 9.4 oz)    Body mass index is 16.85 kg/m.  Estimated Creatinine Clearance: 34.9 mL/min (by C-G formula based on SCr of 0.94 mg/dL).   Patient is candidate for enoxaparin 40mg  every 24 hours based on CrCl now >4ml/min or Weight <45kg  DESCRIPTION: Pharmacy has adjusted enoxaparin dose per [facility] policy.  Patient is now receiving enoxaparin 40 mg every 24 hours    31m, PharmD Clinical Pharmacist 10/15/2021 9:14 AM

## 2021-10-16 DIAGNOSIS — E162 Hypoglycemia, unspecified: Secondary | ICD-10-CM | POA: Diagnosis not present

## 2021-10-16 DIAGNOSIS — L8990 Pressure ulcer of unspecified site, unspecified stage: Secondary | ICD-10-CM | POA: Diagnosis present

## 2021-10-16 LAB — CBC
HCT: 34.5 % — ABNORMAL LOW (ref 36.0–46.0)
Hemoglobin: 10.8 g/dL — ABNORMAL LOW (ref 12.0–15.0)
MCH: 29.3 pg (ref 26.0–34.0)
MCHC: 31.3 g/dL (ref 30.0–36.0)
MCV: 93.8 fL (ref 80.0–100.0)
Platelets: 119 10*3/uL — ABNORMAL LOW (ref 150–400)
RBC: 3.68 MIL/uL — ABNORMAL LOW (ref 3.87–5.11)
RDW: 17.3 % — ABNORMAL HIGH (ref 11.5–15.5)
WBC: 5.8 10*3/uL (ref 4.0–10.5)
nRBC: 0 % (ref 0.0–0.2)

## 2021-10-16 LAB — GLUCOSE, RANDOM
Glucose, Bld: 98 mg/dL (ref 70–99)
Glucose, Bld: 111 mg/dL — ABNORMAL HIGH (ref 70–99)
Glucose, Bld: 103 mg/dL — ABNORMAL HIGH (ref 70–99)
Glucose, Bld: 110 mg/dL — ABNORMAL HIGH (ref 70–99)
Glucose, Bld: 117 mg/dL — ABNORMAL HIGH (ref 70–99)

## 2021-10-16 LAB — GLUCOSE, CAPILLARY: Glucose-Capillary: 65 mg/dL — ABNORMAL LOW (ref 70–99)

## 2021-10-16 LAB — SODIUM
Sodium: 139 mmol/L (ref 135–145)
Sodium: 143 mmol/L (ref 135–145)
Sodium: 141 mmol/L (ref 135–145)
Sodium: 138 mmol/L (ref 135–145)

## 2021-10-16 LAB — BASIC METABOLIC PANEL
Anion gap: 6 (ref 5–15)
BUN: 9 mg/dL (ref 8–23)
CO2: 26 mmol/L (ref 22–32)
Calcium: 9 mg/dL (ref 8.9–10.3)
Chloride: 109 mmol/L (ref 98–111)
Creatinine, Ser: 0.89 mg/dL (ref 0.44–1.00)
GFR, Estimated: >60 mL/min (ref >60)
Glucose, Bld: 94 mg/dL (ref 70–99)
Potassium: 3.1 mmol/L — ABNORMAL LOW (ref 3.5–5.1)
Sodium: 141 mmol/L (ref 135–145)

## 2021-10-16 LAB — LACTIC ACID, PLASMA
Lactic Acid, Venous: 2.8 mmol/L (ref 0.5–1.9)
Lactic Acid, Venous: 3.6 mmol/L (ref 0.5–1.9)

## 2021-10-16 LAB — MAGNESIUM: Magnesium: 1.8 mg/dL (ref 1.7–2.4)

## 2021-10-16 MED ORDER — LATANOPROST 0.005 % OP SOLN
1.0000 [drp] | Freq: Every day | OPHTHALMIC | Status: DC
  Administered 2021-10-16 – 2021-10-20 (×5): 1 [drp] via OPHTHALMIC
  Filled 2021-10-16: qty 2.5

## 2021-10-16 MED ORDER — DORZOLAMIDE HCL 2 % OP SOLN
1.0000 [drp] | Freq: Three times a day (TID) | OPHTHALMIC | Status: DC
  Administered 2021-10-16 – 2021-10-20 (×14): 1 [drp] via OPHTHALMIC
  Filled 2021-10-16: qty 10

## 2021-10-16 MED ORDER — POTASSIUM CHLORIDE 20 MEQ PO PACK
40.0000 meq | PACK | Freq: Two times a day (BID) | ORAL | Status: AC
  Administered 2021-10-16 (×2): 40 meq via ORAL
  Filled 2021-10-16 (×2): qty 2

## 2021-10-16 NOTE — Progress Notes (Signed)
PROGRESS NOTE    Sara Bailey  STM:[id number] DOB: September 23, 1940 DOA: 10/14/2021 PCP: Keane Police, MD   Brief Narrative:  Patient is a 81 year old African-American female with history of coronary artery disease, hypertension, CVA who presented here with complaint of confusion from a skilled nursing facility.  She was noted to be hypoglycemic at her facility.  Patient is a poor historian due to history of dementia.  On presentation ,she was hemodynamically stable with mild sinus bradycardia.  Labs showed sodium of 153, creatinine of 1.3, lactic acid of 2.9.  Chest x-ray did not show any acute cardiopulmonary disease.  CT head was negative for acute intracranial findings.  UA was suspicious for UTI with leukocytes.  Patient was started on ceftriaxone, gentle IV fluids and admitted for further management.  Assessment & Plan:   Altered mental status:  -Unclear etiology but hypoglycemia, dehydration, UTI?   -CT head did not show any acute intracranial abnormality.  Patient has history of dementia. -Continue delirium precautions, frequent reorientation. -Improving overall   Hypoglycemic episode: Monitor blood sugars, currently on D5.  Avoid hypoglycemic agents.  Improving   AKI/hypernatremia/ hypokalemia: Could be from dehydration.  Continue gentle hypotonic fluids.  Monitor BMP.  AKI has resolved. -Replenish potassium.  Magnesium: WNL.  Monitor electrolytes and kidney function closely  Lactic acidosis: Increased IV fluid rate.  Trend lactic acid.   UTI: UA suspicious for UTI.   - on ceftriaxone.  Follow-up urine culture   Dementia/depression: On Aricept, Namenda, Prozac,rameron, Depakote.  Continue supportive care   Hypothyroidism: Continue Synthyroid   Hypertension: Currently blood pressure stable.     Coronary artery  disease/bradycardia: No anginal symptoms.  On  statin, aspirin, beta-blocker therapy.  Will hold metoprolol due to bradycardia.   Prolonged Qtc: QTc of 522.  Improved to 448.  Repeat EKG today  Normocytic anemia: H&H slightly trending down.  We will continue to monitor.  No indication of blood transfusion at this time  Thrombocytopenia: Chronic.  No signs of bleeding.  Continue to monitor  Debility/deconditioning: Patient is from skilled nursing facility.  does not walk at baseline.TOC consulted.  Plan is to discharge back skilled nursing facility when stable -PT/OT consulted  DVT prophylaxis: Lovenox Code Status: DNR Family Communication:  None present at bedside.  Plan of care discussed with patient in length and she verbalized understanding and agreed with it.  I called patient's brother.  I talked to her brother and his wife on the phone.  I answered all their questions.  Disposition Plan: To be determined  Consultants:  None  Procedures:  None  Antimicrobials:  Rocephin Status is: Inpatient    Subjective: Patient seen and examined.  Alert and following commands.  Denies any complaints.  No acute event overnight.  Remained afebrile.  Objective: Vitals:   10/15/21 2337 10/16/21 0417 10/16/21 0747 10/16/21 1253  BP: 134/65 119/72 (!) 130/102 129/88  Pulse: 93 (!) 44 81 70  Resp: 18 18 14 14   Temp: 98.2 F (36.8 C) 97.8 F (36.6 C) 98.2 F (36.8 C) 97.6 F (36.4 C)  TempSrc:   Oral Oral  SpO2: 94% 100% 100% (!) 70%  Weight:      Height:        Intake/Output Summary (Last 24 hours) at 10/16/2021 1418 Last data filed at 10/16/2021 1100 Gross per 24 hour  Intake 1409.48 ml  Output 950 ml  Net 459.48 ml   Filed Weights   10/14/21 1200 10/14/21 2004  Weight: 53.1 kg 48.8 kg  Examination:  General exam: Appears calm and comfortable  Respiratory system: Clear to auscultation. Respiratory effort normal. Cardiovascular system: S1 & S2 heard, RRR. No JVD, murmurs, rubs, gallops or clicks. No pedal edema. Gastrointestinal system: Abdomen is nondistended, soft and nontender. No organomegaly or masses felt. Normal  bowel sounds heard. Central nervous system: Alert and oriented. No focal neurological deficits. Extremities: Symmetric 5 x 5 power. Skin: No rashes, lesions or ulcers Psychiatry: Judgement and insight appear normal. Mood & affect appropriate.    Data Reviewed: I have personally reviewed following labs and imaging studies  CBC: Recent Labs  Lab 10/14/21 0811 10/15/21 0335 10/16/21 0329  WBC 5.7 6.1 5.8  NEUTROABS 2.8  --   --   HGB 11.1* 10.5* 10.8*  HCT 36.1 35.1* 34.5*  MCV 97.0 98.3 93.8  PLT 135* 112* [id number]*   Basic Metabolic Panel: Recent Labs  Lab 10/14/21 0811 10/14/21 0918 10/15/21 0335 10/15/21 0724 10/15/21 1426 10/15/21 1827 10/15/21 2225 10/16/21 0329 10/16/21 0803 10/16/21 1141  NA 153* 153* 155*   < > 147* 144 145 141 139  --   K 4.1 4.0 3.4*  --   --   --   --  3.1*  --   --   CL 118* 118* 124*  --   --   --   --  109  --   --   CO2 30 30 26   --   --   --   --  26  --   --   GLUCOSE 342* 234* 157*   < > 108* 110* 124* 94  --  111*  BUN 27* 27* 16  --   --   --   --  9  --   --   CREATININE 1.35* 1.33* 0.94  --   --   --   --  0.89  --   --   CALCIUM 9.9 9.9 9.0  --   --   --   --  9.0  --   --    < > = values in this interval not displayed.   GFR: Estimated Creatinine Clearance: 36.9 mL/min (by C-G formula based on SCr of 0.89 mg/dL). Liver Function Tests: Recent Labs  Lab 10/14/21 0811  AST 19  ALT 16  ALKPHOS 52  BILITOT 0.3  PROT 5.9*  ALBUMIN 2.7*   No results for input(s): "LIPASE", "AMYLASE" in the last 168 hours. No results for input(s): "AMMONIA" in the last 168 hours. Coagulation Profile: No results for input(s): "INR", "PROTIME" in the last 168 hours. Cardiac Enzymes: No results for input(s): "CKTOTAL", "CKMB", "CKMBINDEX", "TROPONINI" in the last 168 hours. BNP (last 3 results) No results for input(s): "PROBNP" in the last 8760 hours. HbA1C: No results for input(s): "HGBA1C" in the last 72 hours. CBG: Recent Labs  Lab  10/15/21 1903 10/15/21 2048 10/15/21 2125 10/15/21 2337 10/16/21 0200  GLUCAP 74 21* 77 94 65*   Lipid Profile: No results for input(s): "CHOL", "HDL", "LDLCALC", "TRIG", "CHOLHDL", "LDLDIRECT" in the last 72 hours. Thyroid Function Tests: Recent Labs    10/14/21 0811  TSH 1.916   Anemia Panel: No results for input(s): "VITAMINB12", "FOLATE", "FERRITIN", "TIBC", "IRON", "RETICCTPCT" in the last 72 hours. Sepsis Labs: Recent Labs  Lab 10/14/21 0811 10/14/21 0817 10/14/21 1133 10/16/21 0803 10/16/21 1141  PROCALCITON <0.10  --   --   --   --   LATICACIDVEN  --  2.9* 3.0* 2.8* 3.6*    Recent  Results (from the past 240 hour(s))  Blood culture (single)     Status: None (Preliminary result)   Collection Time: 10/14/21 10:20 AM   Specimen: BLOOD  Result Value Ref Range Status   Specimen Description BLOOD RIGHT ARM  Final   Special Requests   Final    BOTTLES DRAWN AEROBIC AND ANAEROBIC Blood Culture adequate volume   Culture   Final    NO GROWTH 2 DAYS Performed at [facility], [address], [place], [place] [address]    Report Status PENDING  Incomplete      Radiology Studies: No results found.  Scheduled Meds:  atorvastatin  80 mg Oral QHS   divalproex  125 mg Oral q morning   divalproex  250 mg Oral QHS   donepezil  10 mg Oral QHS   dorzolamide  1 drop Both Eyes TID   enoxaparin (LOVENOX) injection  40 mg Subcutaneous Q24H   feeding supplement  237 mL Oral BID BM   FLUoxetine  10 mg Oral Daily   latanoprost  1 drop Both Eyes QHS   memantine  10 mg Oral BID   mirtazapine  15 mg Oral QHS   potassium chloride  40 mEq Oral BID   senna-docusate  1 tablet Oral BID   Continuous Infusions:  cefTRIAXone (ROCEPHIN)  IV 1 g (10/16/21 0946)   dextrose 100 mL/hr at 10/16/21 0600     LOS: 1 day   Time spent: 35 minutes   Dola Lunsford Loann Quill, MD [facility]  If 7PM-7AM, please contact night-coverage [email or web address] 10/16/2021, 2:18 PM

## 2021-10-16 NOTE — Evaluation (Signed)
Occupational Therapy Evaluation Patient Details Name: Sara Bailey MRN: [id number] DOB: 1941-01-28 Today's Date: 10/16/2021   History of Present Illness Sara Bailey is a 81 y.o. African-American female with medical history significant for coronary artery disease, hypertension and CVA, who presented to the emergency room with acute onset of decreased responsiveness at her [facility] skilled nursing facility where she resides.   Clinical Impression   Patient seen for OT evaluation, family present who provided history. Patient presenting with decreased cognition, safety awareness, strength, and balance impacting safety and independence in ADLs. At baseline, pt resides in a SNF [facility]) where she receives assistance for all ADLs. Per brother, pt has not walked in 1.5 years. Patient currently functioning at Total A for all bed mobility, total A for most ADLs at bed level, and Max A to maintain static sitting balance at EOB. Pt was unable to answer orientation questions and inconsistently followed one step commands this date. Pt's family voiced desire for pt to return to [facility] and receive OT/PT. Patient will benefit from acute OT to increase overall independence in the areas of ADLs and functional mobility in order to safely discharge to next venue of care. Upon hospital discharge, recommend STR to maximize pt safety and return to PLOF.      Recommendations for follow up therapy are one component of a multi-disciplinary discharge planning process, led by the attending physician.  Recommendations may be updated based on patient status, additional functional criteria and insurance authorization.   Follow Up Recommendations  Skilled nursing-short term rehab (<3 hours/day)    Assistance Recommended at Discharge Frequent or constant Supervision/Assistance  Patient can return home with the following Assistance with feeding;Two people to help with walking and/or transfers;Two people to help  with bathing/dressing/bathroom;Direct supervision/assist for medications management;Direct supervision/assist for financial management;Assist for transportation;Assistance with cooking/housework    Functional Status Assessment  Patient has had a recent decline in their functional status and demonstrates the ability to make significant improvements in function in a reasonable and predictable amount of time.  Equipment Recommendations  Other (comment) (defer to next venue of care)    Recommendations for Other Services       Precautions / Restrictions Precautions Precautions: Fall Restrictions Weight Bearing Restrictions: No      Mobility Bed Mobility Overal bed mobility: Needs Assistance Bed Mobility: Supine to Sit, Sit to Supine     Supine to sit: Total assist Sit to supine: Total assist   General bed mobility comments: Total A +1 for supine <> sit, Max A +2 to scoot pt up towards [facility]    Transfers                          Balance Overall balance assessment: Needs assistance Sitting-balance support: Feet supported Sitting balance-Leahy Scale: Zero Sitting balance - Comments: assist required to place hands on surface of bed in attempt to support self, however, unsuccessful and required Max A to maintain static sitting balance                                   ADL either performed or assessed with clinical judgement   ADL Overall ADL's : Needs assistance/impaired     Grooming: Cueing for sequencing;Bed level;Wash/dry face;Set up;Supervision/safety Grooming Details (indicate cue type and reason): able to complete face washing with verbal cues and visual demonstration  Lower Body Dressing: Total assistance;Bed level                       Vision Patient Visual Report: No change from baseline       Perception     Praxis      Pertinent Vitals/Pain Pain Assessment Pain Assessment:  (unable to state)     Hand  Dominance     Extremity/Trunk Assessment Upper Extremity Assessment Upper Extremity Assessment: Generalized weakness   Lower Extremity Assessment Lower Extremity Assessment: Generalized weakness       Communication Communication Communication: No difficulties   Cognition Arousal/Alertness: Lethargic Behavior During Therapy: Flat affect Overall Cognitive Status: History of cognitive impairments - at baseline                                 General Comments: Pt with dementia at baseline. Unable to answer orientation questions, however, stated that she lives in "[place]" which is where [facility]. Per brother, pt is occasionally oriented to self at baseline. Pt inconsistently followed one step commands. Required max multimodal cues, increased time, and repetition for one step commands and sequencing of tasks. Unable to respond with yes/no or head nods, however, did state at end of session "nice meeting you."     General Comments       Exercises Other Exercises Other Exercises: OT provided education re: role of OT, OT POC, post acute recs, sitting up for all meals, EOB/OOB mobility with assistance, home/fall safety.     Shoulder Instructions      Home Living Family/patient expects to be discharged to:: Skilled nursing facility                                 Additional Comments: Resides at [facility] (SNF), has been there for 1 year. Lived in ALF prior to this. Brother and brother's wife have been her caregivers, decline in function after previous CVA several years ago.      Prior Functioning/Environment Prior Level of Function : Needs assist             Mobility Comments: Per brother and brother's wife, pt has not walked in 1.5 years ADLs Comments: Receives assist for all ADLs at [employer] (bed level)        OT Problem List: Decreased strength;Decreased knowledge of use of DME or AE;Decreased range of motion;Decreased activity  tolerance;Decreased cognition;Decreased safety awareness;Impaired balance (sitting and/or standing)      OT Treatment/Interventions: Self-care/ADL training;Therapeutic exercise;Patient/family education;Balance training;Therapeutic activities;DME and/or AE instruction;Cognitive remediation/compensation;Energy conservation    OT Goals(Current goals can be found in the care plan section) Acute Rehab OT Goals Patient Stated Goal: unable to state OT Goal Formulation: With family Time For Goal Achievement: 10/30/21 Potential to Achieve Goals: Poor ADL Goals Pt Will Perform Eating: bed level;with mod assist Pt Will Perform Grooming: bed level;with max assist Pt Will Perform Upper Body Dressing: bed level;with max assist Pt Will Perform Lower Body Dressing: bed level;with max assist Pt Will Transfer to Toilet: with +2 assist;squat pivot transfer;bedside commode;with max assist Pt Will Perform Toileting - Clothing Manipulation and hygiene: with max assist;bed level  OT Frequency: Min 2X/week    Co-evaluation              AM-PAC OT "6 Clicks" Daily Activity     Outcome Measure Help  from another person eating meals?: A Lot Help from another person taking care of personal grooming?: A Lot Help from another person toileting, which includes using toliet, bedpan, or urinal?: Total Help from another person bathing (including washing, rinsing, drying)?: Total Help from another person to put on and taking off regular upper body clothing?: Total Help from another person to put on and taking off regular lower body clothing?: Total 6 Click Score: 8   End of Session Nurse Communication: Mobility status  Activity Tolerance: Patient tolerated treatment well Patient left: in bed;with call bell/phone within reach;with bed alarm set;with family/visitor present  OT Visit Diagnosis: Other abnormalities of gait and mobility (R26.89);Muscle weakness (generalized) (M62.81);Other symptoms and signs involving  cognitive function                Time: 1545-1619 OT Time Calculation (min): 34 min Charges:  OT General Charges $OT Visit: 1 Visit OT Evaluation $OT Eval Low Complexity: 1 Low  [facility] MS, OTR/L ascom [phone]  10/16/21, 6:32 PM

## 2021-10-17 ENCOUNTER — Inpatient Hospital Stay: Payer: Medicare PPO

## 2021-10-17 DIAGNOSIS — E162 Hypoglycemia, unspecified: Secondary | ICD-10-CM | POA: Diagnosis not present

## 2021-10-17 LAB — URINE CULTURE: Culture: >=100000 — AB

## 2021-10-17 LAB — CBC
HCT: 32.8 % — ABNORMAL LOW (ref 36.0–46.0)
Hemoglobin: 10.3 g/dL — ABNORMAL LOW (ref 12.0–15.0)
MCH: 29.5 pg (ref 26.0–34.0)
MCHC: 31.4 g/dL (ref 30.0–36.0)
MCV: 94 fL (ref 80.0–100.0)
Platelets: 129 10*3/uL — ABNORMAL LOW (ref 150–400)
RBC: 3.49 MIL/uL — ABNORMAL LOW (ref 3.87–5.11)
RDW: 16.7 % — ABNORMAL HIGH (ref 11.5–15.5)
WBC: 5.5 10*3/uL (ref 4.0–10.5)
nRBC: 0 % (ref 0.0–0.2)

## 2021-10-17 LAB — BASIC METABOLIC PANEL
Anion gap: 4 — ABNORMAL LOW (ref 5–15)
BUN: 10 mg/dL (ref 8–23)
CO2: 27 mmol/L (ref 22–32)
Calcium: 9 mg/dL (ref 8.9–10.3)
Chloride: 108 mmol/L (ref 98–111)
Creatinine, Ser: 0.87 mg/dL (ref 0.44–1.00)
GFR, Estimated: >60 mL/min (ref >60)
Glucose, Bld: 101 mg/dL — ABNORMAL HIGH (ref 70–99)
Potassium: 3.9 mmol/L (ref 3.5–5.1)
Sodium: 139 mmol/L (ref 135–145)

## 2021-10-17 LAB — MAGNESIUM: Magnesium: 1.8 mg/dL (ref 1.7–2.4)

## 2021-10-17 LAB — LACTIC ACID, PLASMA: Lactic Acid, Venous: 2.6 mmol/L (ref 0.5–1.9)

## 2021-10-17 LAB — PROCALCITONIN: Procalcitonin: <0.1 ng/mL

## 2021-10-17 NOTE — TOC Progression Note (Signed)
Transition of Care [facility]) - Progression Note    Patient Details  Name: Sara Bailey MRN: [id number] Date of Birth: 1940/06/20  Transition of [facility]) CM/SW Contact  Izola Price, RN Phone Number: 10/17/2021, 1:30 PM  Clinical Narrative:  9/16: Patient was assigned to go back to [facility] Room B12B today but now pending CxR per provider. Report was to be called to [phone] per Bryson Dames, the weekend [facility]. Informed Tammy of changed in status/discharge. A FL2 was inboxed today.  Simmie Davies RN CM          Expected Discharge Plan and Services                                                 Social Determinants of Health (SDOH) Interventions    Readmission Risk Interventions     No data to display

## 2021-10-17 NOTE — NC FL2 (Signed)
[place] LEVEL OF CARE SCREENING TOOL     IDENTIFICATION  Patient Name: Sara Bailey Birthdate: [birth year] Sex: female Admission Date (Current Location): 10/14/2021  [facility] and [place] Number:  Engineering geologist and Address:  [facility], [address] [place], [place] [address]      Provider Number: [id number]  Attending Physician Name and Address:  Mckinley Jewel, MD  Relative Name and Phone Number:  Eulis Canner (Brother)   [phone] [facility])  Power of Attorney    Current Level of Care: SNF Recommended Level of Care: [place] Prior Approval Number: [id number]  Date Approved/Denied: 07/13/17 PASRR Number: [id number]  Discharge Plan: SNF    Current Diagnoses: Patient Active Problem List   Diagnosis Date Noted   Pressure injury of skin 10/16/2021   Hypernatremia 10/15/2021   Hypoglycemia 10/14/2021   Acute lower UTI 10/14/2021   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[place]) 10/14/2021   Coronary artery disease 07/10/2020   Dementia with behavioral disturbance ([place]) 07/10/2020   Glaucoma 07/10/2020   Hypothyroidism 07/10/2020   Prediabetes 07/10/2020   Chronic kidney disease (CKD) stage G3a/A1, moderately decreased glomerular filtration rate (GFR) between 45-59 mL/min/1.73 square meter and albuminuria creatinine ratio less than 30 mg/g ([place]) 07/10/2020   AMS (altered mental status) 07/10/2020   Encephalopathy chronic 06/22/2018   Altered mental status 06/17/2018   Former tobacco use 12/04/2017   Essential hypertension 07/11/2017   CVA (cerebral vascular accident) ([place]) 07/11/2017   Hyperlipidemia 07/11/2017   Personal history of transient ischemic attack (TIA), 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 residual deficits 07/11/2017    Orientation RESPIRATION BLADDER Height & Weight     Self  O2 External catheter Weight: 48.8 kg Height:  5\' 7"  (170.2 cm) (stated by the patient)  BEHAVIORAL SYMPTOMS/MOOD  NEUROLOGICAL BOWEL NUTRITION STATUS      Incontinent Diet  AMBULATORY STATUS COMMUNICATION OF NEEDS Skin   Total Care Verbally (Htx of cognitive impairment)                         Personal Care Assistance Level of Assistance  Total care       Total Care Assistance: Maximum assistance   Functional Limitations Info             SPECIAL CARE FACTORS FREQUENCY  PT (By licensed PT), OT (By licensed OT)     PT Frequency: 5x/week OT Frequency: 5x/week            Contractures Contractures Info: Not present    Additional Factors Info  Code Status, Allergies Code Status Info: DNR Allergies Info: No Know Allergies/please see chart for any updates           Current Medications (10/17/2021):  This is the current hospital active medication list Current Facility-Administered Medications  Medication Dose Route Frequency Provider Last Rate Last Admin   acetaminophen (TYLENOL) tablet 650 mg  650 mg Oral Q6H PRN Mansy, Jan A, MD       Or   acetaminophen (TYLENOL) suppository 650 mg  650 mg Rectal Q6H PRN Mansy, Jan A, MD       atorvastatin (LIPITOR) tablet 80 mg  80 mg Oral QHS Mansy, Jan A, MD   80 mg at 10/16/21 2229   cefTRIAXone (ROCEPHIN) 1 g in sodium chloride 0.9 % 100 mL IVPB  1 g Intravenous Q24H Shelly Coss, MD 200 mL/hr at 10/17/21 0937 1 g at 10/17/21 0937   dextrose 5 % solution  Intravenous Continuous Pahwani, Michell Heinrich, MD 125 mL/hr at 10/17/21 1111 New Bag at 10/17/21 1111   divalproex (DEPAKOTE SPRINKLE) capsule 125 mg  125 mg Oral q morning Mansy, Jan A, MD   125 mg at 10/17/21 0942   divalproex (DEPAKOTE SPRINKLE) capsule 250 mg  250 mg Oral QHS Mansy, Jan A, MD   250 mg at 10/16/21 2229   donepezil (ARICEPT) tablet 10 mg  10 mg Oral QHS Mansy, Jan A, MD   10 mg at 10/16/21 2229   dorzolamide (TRUSOPT) 2 % ophthalmic solution 1 drop  1 drop Both Eyes TID Pahwani, Rinka R, MD   1 drop at 10/17/21 0940   enoxaparin (LOVENOX) injection 40 mg  40 mg  Subcutaneous Q24H Chinita Greenland A, RPH   40 mg at 10/16/21 2230   feeding supplement (ENSURE ENLIVE / ENSURE PLUS) liquid 237 mL  237 mL Oral BID BM Adhikari, Amrit, MD   237 mL at 10/17/21 1008   FLUoxetine (PROZAC) capsule 10 mg  10 mg Oral Daily Mansy, Jan A, MD   10 mg at 10/17/21 0942   latanoprost (XALATAN) 0.005 % ophthalmic solution 1 drop  1 drop Both Eyes QHS Pahwani, Rinka R, MD   1 drop at 10/16/21 2230   LORazepam (ATIVAN) tablet 0.5 mg  0.5 mg Oral Daily PRN Mansy, Jan A, MD       magnesium hydroxide (MILK OF MAGNESIA) suspension 30 mL  30 mL Oral Daily PRN Mansy, Jan A, MD       memantine [facility]) tablet 10 mg  10 mg Oral BID Mansy, Jan A, MD   10 mg at 10/17/21 0940   mirtazapine (REMERON) tablet 15 mg  15 mg Oral QHS Mansy, Jan A, MD   15 mg at 10/16/21 2229   ondansetron (ZOFRAN) tablet 4 mg  4 mg Oral Q6H PRN Mansy, Jan A, MD       Or   ondansetron [facility]) injection 4 mg  4 mg Intravenous Q6H PRN Mansy, Jan A, MD       senna-docusate (Senokot-S) tablet 1 tablet  1 tablet Oral BID Mansy, Jan A, MD   1 tablet at 10/17/21 0940   traZODone (DESYREL) tablet 25 mg  25 mg Oral QHS PRN Mansy, Arvella Merles, MD         Discharge Medications: Please see discharge summary for a list of discharge medications.  Relevant Imaging Results:  Relevant Lab Results:   Additional Information ss: [id number]  Izola Price, RN

## 2021-10-17 NOTE — Progress Notes (Signed)
PT Cancellation Note  Patient Details Name: Sara Bailey MRN: [id number] DOB: Jun 17, 1940   Cancelled Treatment:     Pt caregivers reports pt not feeling well today and pt has been lethargic with new s/s of coughing. Pt to be evaluated with x-ray. X-ray team comes to bedside while off there is discussing the potential for physical therapy evaluation this date.  Patient caregivers declined PT this date open for physical therapy tomorrow.  Physical therapist did have occupational therapy for OT eval/treat at this time.   Particia Lather 10/17/2021, 1:40 PM

## 2021-10-17 NOTE — Progress Notes (Signed)
PROGRESS NOTE    Sara Bailey  KXF:[id number] DOB: 18-Mar-1940 DOA: 10/14/2021 PCP: Alvester Morin, MD   Brief Narrative:  Patient is a 81 year old African-American female with history of coronary artery disease, hypertension, CVA who presented here with complaint of confusion from a skilled nursing facility.  She was noted to be hypoglycemic at her facility.  Patient is a poor historian due to history of dementia.  On presentation ,she was hemodynamically stable with mild sinus bradycardia.  Labs showed sodium of 153, creatinine of 1.3, lactic acid of 2.9.  Chest x-ray did not show any acute cardiopulmonary disease.  CT head was negative for acute intracranial findings.  UA was suspicious for UTI with leukocytes.  Patient was started on ceftriaxone, gentle IV fluids and admitted for further management.  Assessment & Plan:   Altered mental status:  -Unclear etiology but hypoglycemia, dehydration, UTI  -CT head did not show any acute intracranial abnormality.  Patient has history of dementia. -Continue delirium precautions, frequent reorientation. -Improving overall  Acute hypoxemic respiratory failure: -As per family: Patient was on oxygen at nursing home couple of days prior to the admission  -we will try to wean off of oxygen.  Chest x-ray negative on admission.    Hypoglycemic episode: Monitor blood sugars, currently on D5.  Avoid hypoglycemic agents.  Improving   AKI/hypernatremia/ hypokalemia: Could be from dehydration.  Continue gentle hypotonic fluids.  Monitor BMP.  AKI has resolved. -Monitor electrolytes and kidney function closely  Lactic acidosis: cont. Gentle hydration.  Trend lactic acid.   Aerococcus urinae UTI:   - on ceftriaxone.     Dementia/depression: On Aricept, Namenda, Prozac,rameron, Depakote.  Continue supportive care   Hypothyroidism: Continue Synthyroid   Hypertension: Currently blood pressure stable.     Coronary artery  disease/bradycardia:  No anginal symptoms.  On  statin, aspirin, beta-blocker therapy.  Will hold metoprolol due to bradycardia.   Prolonged Qtc: QTc of 522. Improved to 448.  Repeat EKG today  Normocytic anemia: H&H slightly trending down.  We will continue to monitor.  No indication of blood transfusion at this time  Thrombocytopenia: Chronic.  No signs of bleeding.  Continue to monitor  Sinus bradycardia: As per patient family: Patient has chronic bradycardia.  Cardiology recommended pacemaker however patient has refused in the past.  debility/deconditioning: Patient is from skilled nursing facility.  does not walk at baseline.TOC consulted.  Plan is to discharge back skilled nursing facility when stable  Chest congestion: repeat CXR  Constipation: Continue senokot  And milk of magnesia  DVT prophylaxis: Lovenox Code Status: DNR Family Communication:  None present at bedside.  Plan of care discussed with patient in length and she verbalized understanding and agreed with it.  Disposition Plan: To be determined  Consultants:  None  Procedures:  None  Antimicrobials:  Rocephin Status is: Inpatient    Subjective: Patient seen and examined.  Alert and following commands.  Denies any complaints.  No acute event overnight.  Remained afebrile.  As per family: Patient seems congested and they are concerned about patient's oxygen requirement.  Patient also had not had bowel movement since 9/14.  Objective: Vitals:   10/17/21 0017 10/17/21 0333 10/17/21 0857 10/17/21 1207  BP: (!) 91/59 (!) 132/55 128/66 126/63  Pulse: (!) 53 73 (!) 45 (!) 46  Resp: 18 18 16 14   Temp: 98.6 F (37 C) 98.2 F (36.8 C) 97.9 F (36.6 C) 98.1 F (36.7 C)  TempSrc: Oral Oral Oral Oral  SpO2: 95%  100%    Weight:      Height:        Intake/Output Summary (Last 24 hours) at 10/17/2021 1330 Last data filed at 10/17/2021 1208 Gross per 24 hour  Intake --  Output 2250 ml  Net -2250 ml    Filed Weights   10/14/21  1200 10/14/21 2004  Weight: 53.1 kg 48.8 kg    Examination:  General exam: Appears calm and comfortable, on nasal cannula, elderly female,  respiratory system: Clear to auscultation. Respiratory effort normal. Cardiovascular system: S1 & S2 heard, RRR. No JVD, murmurs, rubs, gallops or clicks. No pedal edema. Gastrointestinal system: Abdomen is nondistended, soft and nontender. No organomegaly or masses felt. Normal bowel sounds heard. Central nervous system: Sleepy but arousable. Skin: No rashes, lesions or ulcers    Data Reviewed: I have personally reviewed following labs and imaging studies  CBC: Recent Labs  Lab 10/14/21 0811 10/15/21 0335 10/16/21 0329 10/17/21 0252  WBC 5.7 6.1 5.8 5.5  NEUTROABS 2.8  --   --   --   HGB 11.1* 10.5* 10.8* 10.3*  HCT 36.1 35.1* 34.5* 32.8*  MCV 97.0 98.3 93.8 94.0  PLT 135* 112* 119* 129*    Basic Metabolic Panel: Recent Labs  Lab 10/14/21 0811 10/14/21 0918 10/15/21 0335 10/15/21 0724 10/16/21 0329 10/16/21 0803 10/16/21 1141 10/16/21 1419 10/16/21 1826 10/16/21 2212 10/17/21 0252  NA 153* 153* 155*   < > 141 139  --  143 141 138 139  K 4.1 4.0 3.4*  --  3.1*  --   --   --   --   --  3.9  CL 118* 118* 124*  --  109  --   --   --   --   --  108  CO2 30 30 26   --  26  --   --   --   --   --  27  GLUCOSE 342* 234* 157*   < > 94 98 111* 103* 110* 117* 101*  BUN 27* 27* 16  --  9  --   --   --   --   --  10  CREATININE 1.35* 1.33* 0.94  --  0.89  --   --   --   --   --  0.87  CALCIUM 9.9 9.9 9.0  --  9.0  --   --   --   --   --  9.0  MG  --   --   --   --   --  1.8  --   --   --   --  1.8   < > = values in this interval not displayed.    GFR: Estimated Creatinine Clearance: 37.7 mL/min (by C-G formula based on SCr of 0.87 mg/dL). Liver Function Tests: Recent Labs  Lab 10/14/21 0811  AST 19  ALT 16  ALKPHOS 52  BILITOT 0.3  PROT 5.9*  ALBUMIN 2.7*    No results for input(s): "LIPASE", "AMYLASE" in the last 168  hours. No results for input(s): "AMMONIA" in the last 168 hours. Coagulation Profile: No results for input(s): "INR", "PROTIME" in the last 168 hours. Cardiac Enzymes: No results for input(s): "CKTOTAL", "CKMB", "CKMBINDEX", "TROPONINI" in the last 168 hours. BNP (last 3 results) No results for input(s): "PROBNP" in the last 8760 hours. HbA1C: No results for input(s): "HGBA1C" in the last 72 hours. CBG: Recent Labs  Lab 10/15/21 1903 10/15/21 2048 10/15/21 2125 10/15/21 2337  10/16/21 0200  GLUCAP 74 21* 77 94 65*    Lipid Profile: No results for input(s): "CHOL", "HDL", "LDLCALC", "TRIG", "CHOLHDL", "LDLDIRECT" in the last 72 hours. Thyroid Function Tests: No results for input(s): "TSH", "T4TOTAL", "FREET4", "T3FREE", "THYROIDAB" in the last 72 hours.  Anemia Panel: No results for input(s): "VITAMINB12", "FOLATE", "FERRITIN", "TIBC", "IRON", "RETICCTPCT" in the last 72 hours. Sepsis Labs: Recent Labs  Lab 10/14/21 0811 10/14/21 0817 10/14/21 1133 10/16/21 0803 10/16/21 1141 10/17/21 0252  PROCALCITON <0.10  --   --   --   --   --   LATICACIDVEN  --    < > 3.0* 2.8* 3.6* 2.6*   < > = values in this interval not displayed.     Recent Results (from the past 240 hour(s))  Blood culture (single)     Status: None (Preliminary result)   Collection Time: 10/14/21 10:20 AM   Specimen: BLOOD  Result Value Ref Range Status   Specimen Description BLOOD RIGHT ARM  Final   Special Requests   Final    BOTTLES DRAWN AEROBIC AND ANAEROBIC Blood Culture adequate volume   Culture   Final    NO GROWTH 3 DAYS Performed at [facility], [address] [place], [place] [address]    Report Status PENDING  Incomplete  Urine Culture     Status: Abnormal   Collection Time: 10/14/21 11:33 AM   Specimen: Urine, Clean Catch  Result Value Ref Range Status   Specimen Description URINE, CLEAN CATCH  Final   Special Requests NONE  Final   Culture >=100,000 COLONIES/mL  AEROCOCCUS URINAE (A)  Final   Report Status 10/17/2021 FINAL  Final      Radiology Studies: No results found.  Scheduled Meds:  atorvastatin  80 mg Oral QHS   divalproex  125 mg Oral q morning   divalproex  250 mg Oral QHS   donepezil  10 mg Oral QHS   dorzolamide  1 drop Both Eyes TID   enoxaparin (LOVENOX) injection  40 mg Subcutaneous Q24H   feeding supplement  237 mL Oral BID BM   FLUoxetine  10 mg Oral Daily   latanoprost  1 drop Both Eyes QHS   memantine  10 mg Oral BID   mirtazapine  15 mg Oral QHS   senna-docusate  1 tablet Oral BID   Continuous Infusions:  cefTRIAXone (ROCEPHIN)  IV 1 g (10/17/21 0937)   dextrose 125 mL/hr at 10/17/21 1111     LOS: 2 days   Time spent: 35 minutes   Solly Derasmo Loann Quill, MD [facility]  If 7PM-7AM, please contact night-coverage [email or web address] 10/17/2021, 1:30 PM

## 2021-10-18 DIAGNOSIS — N390 Urinary tract infection, site not specified: Secondary | ICD-10-CM | POA: Diagnosis not present

## 2021-10-18 DIAGNOSIS — I2510 Atherosclerotic heart disease of native coronary artery without angina pectoris: Secondary | ICD-10-CM | POA: Diagnosis not present

## 2021-10-18 DIAGNOSIS — F0153 Vascular dementia, unspecified severity, with mood disturbance: Secondary | ICD-10-CM | POA: Diagnosis not present

## 2021-10-18 DIAGNOSIS — E162 Hypoglycemia, unspecified: Secondary | ICD-10-CM | POA: Diagnosis not present

## 2021-10-18 DIAGNOSIS — E870 Hyperosmolality and hypernatremia: Secondary | ICD-10-CM

## 2021-10-18 LAB — GLUCOSE, CAPILLARY
Glucose-Capillary: 67 mg/dL — ABNORMAL LOW (ref 70–99)
Glucose-Capillary: 80 mg/dL (ref 70–99)
Glucose-Capillary: 107 mg/dL — ABNORMAL HIGH (ref 70–99)
Glucose-Capillary: 122 mg/dL — ABNORMAL HIGH (ref 70–99)

## 2021-10-18 LAB — CBC
HCT: 34.8 % — ABNORMAL LOW (ref 36.0–46.0)
Hemoglobin: 11.2 g/dL — ABNORMAL LOW (ref 12.0–15.0)
MCH: 30 pg (ref 26.0–34.0)
MCHC: 32.2 g/dL (ref 30.0–36.0)
MCV: 93.3 fL (ref 80.0–100.0)
Platelets: 166 10*3/uL (ref 150–400)
RBC: 3.73 MIL/uL — ABNORMAL LOW (ref 3.87–5.11)
RDW: 16.9 % — ABNORMAL HIGH (ref 11.5–15.5)
WBC: 6.7 10*3/uL (ref 4.0–10.5)
nRBC: 0.3 % — ABNORMAL HIGH (ref 0.0–0.2)

## 2021-10-18 LAB — MRSA NEXT GEN BY PCR, NASAL: MRSA by PCR Next Gen: NOT DETECTED

## 2021-10-18 NOTE — Assessment & Plan Note (Addendum)
Frequently reposition patient to offload area. Monitor for signs of irritation or infection.   Pressure Ulcer  Duration          Pressure Injury 10/14/21 Sacrum Medial Stage 1 -  Intact skin with non-blanchable redness of a localized area usually over a bony prominence. Healing stage 1 with thick skin 3 days

## 2021-10-18 NOTE — Hospital Course (Addendum)
Sara Bailey was admitted to the hospital with the working diagnosis of altered mental status due to urinary tract infection, complicated with hypoglycemia.   81 yo female with the past medical history of coronary artery disease, CVA and hypertension who presented with altered mental status. Noted decline in her mentation over last few days prior to her transfer from the nursing home. On the day of her transfer she was found hypoglycemic and was decided to transfer her to the hospital. On her initial physical examination her blood pressure was 126/66, HR 47, RR 12 and 02 saturation 97,9. Lungs with no wheezing or rales, heart with S1 and S2 present and rhythmic, abdomen with no distention, no lower extremity edema.   NA 153, K 4,1 Cl 118 bicarbonate 39 glucose 342 bun 27 cr 1,35  Lactic acid 2,9  Wbc 5,7 hgb 11,1 plt 135  Urine analysis with SG 1,023, 21-50 wbc, 6-10 rbc  Urine culture >100,000 CFU aerococcus   Head CT with no acute changes  Chest radiograph with hyperinflation with no infiltrates.   EKG 42 bpm, left axis deviation, left anterior fascicular block, right bundle branch block, qtc 522, sinus rhythm with no significant ST segment or T wave changes.   Patient was placed on antibiotic therapy for urinary tract infection and clinically improved.   Hospital course complicated by hypoglycemia requiring dextrose IV fluids, in the setting of very poor PO intake.   Monitored off of D5w fluids, glucose readings have been in 70's to 80's for the most part.  Oral intake remains minimal.  Patient mostly sleeps and has demonstrated almost no interaction with staff.    Goals of care were discussed with family and decision made to have patient return to [facility] with hospice following.  Patient was started on comfort measures on afternoon of 9/21.    Patient is stable for discharge today 9/22.

## 2021-10-18 NOTE — Progress Notes (Addendum)
Progress Note   Patient: Sara Bailey [id number] DOB: [birth year] DOA: 10/14/2021     3 DOS: the patient was seen and examined on 10/18/2021   Brief hospital course: Mrs. Sara Bailey was admitted to the hospital with the working diagnosis of altered mental status due to urinary tract infection, complicated with hypoglycemia.   81 yo female with the past medical history of coronary artery disease, CVA and hypertension who presented with altered mental status. Noted decline in her mentation over last few days prior to her transfer from the nursing home. On the day of her transfer she was found hypoglycemic and was decided to transfer her to the hospital. On her initial physical examination her blood pressure was 126/66, HR 47, RR 12 and 02 saturation 97,9. Lungs with no wheezing or rales, heart with S1 and S2 present and rhythmic, abdomen with no distention, no lower extremity edema.   NA 153, K 4,1 Cl 118 bicarbonate 39 glucose 342 bun 27 cr 1,35  Lactic acid 2,9  Wbc 5,7 hgb 11,1 plt 135  Urine analysis with SG 1,023, 21-50 wbc, 6-10 rbc  Urine culture >100,000 CFU aerococcus   Head CT with no acute changes  Chest radiograph with hyperinflation with no infiltrates.   EKG 42 bpm, left axis deviation, left anterior fascicular block, right bundle branch block, qtc 522, sinus rhythm with no significant ST segment or T wave changes.   Patient was placed on antibiotic therapy for urinary tract infection with good toleration.   Assessment and Plan: * Hypoglycemia Capillary glucose 90 and 107  Plan to continue IV dextrose. Patient with swallow dysfunction, continue with dysphagia 1 diet.  Aspiration precautions Continue capillary glucose monitoring.    Acute lower UTI Patient has completed antibiotic therapy with good toleration.   Arteriosclerotic dementia with depression [facility]) Patient with no agitation, continue with divalproex, donepezil, and fluoxetine.  Continue with memantine and  mirtazapine.  As needed lorazepam.  Coronary artery disease Continue with aspirin and atorvastatin Patient with no chest pain.   Essential hypertension Continue to hold on blood pressure agents due to risk of hypotension.   Hypernatremia Serum Na has been improving Follow up electrolytes in am Renal function has been stable with serum cr at 0,87   Hypothyroidism Continue with levothyroxine.   Pressure injury of skin    Pressure Ulcer  Duration          Pressure Injury 10/14/21 Sacrum Medial Stage 1 -  Intact skin with non-blanchable redness of a localized area usually over a bony prominence. Healing stage 1 with thick skin 3 days                  Subjective: Patient is feeling well, no agitation, tolerating po   Physical Exam: Vitals:   10/18/21 0742 10/18/21 1204 10/18/21 1211 10/18/21 1552  BP: (!) 141/74 (!) 138/57  (!) 113/54  Pulse: (!) 56 (!) 48  60  Resp: 16 12 16 13   Temp: 97.8 F (36.6 C) 98.7 F (37.1 C)  98.8 F (37.1 C)  TempSrc: Oral Oral  Axillary  SpO2: 100% 95%    Weight:      Height:       Neurology awake and alert,. Positive confusion but not agitation ENT with mild pallor Cardiovascular with S1 and S2 present and rhythmic with no gallops Respiratory with no rales or wheezing Abdomen with no distention  No lower extremity edema   Data Reviewed:    Family Communication: no family at  the bedside. I called her sister, not able to reach her over the phone, I left a message.   Disposition: Status is: Inpatient Remains inpatient appropriate because: hypoglycemia   Planned Discharge Destination: Home   Author: Tawni Millers, MD 10/18/2021 4:44 PM  For on call review [email or web address].

## 2021-10-18 NOTE — Progress Notes (Signed)
PT Cancellation Note  Patient Details Name: Sara Bailey MRN: [id number] DOB: 1940-11-13   Cancelled Treatment:    Reason Eval/Treat Not Completed: PT screened, no needs identified, will sign off. Per review, pt with history of dementia with confusion at baseline. History obtained from chart. Pt from LTC with plans to return after medical status improves. Pt is total care at baseline and is not at enough functional change to recommend SNF. Discussed with care team and will sign off at this time.   Raven Harmes 10/18/2021, 1:27 PM Greggory Stallion, PT, DPT, GCS [phone]

## 2021-10-18 NOTE — Progress Notes (Signed)
OT Cancellation Note  Patient Details Name: Sara Bailey MRN: [id number] DOB: 1940-10-19   Cancelled Treatment:    Reason Eval/Treat Not Completed: Other (comment). Patient with history of dementia and confusion at baseline. Per H&P and discussion with family during OT eval over the weekend, pt has been resident of SNF for the past year. After discussion with care team, found that pt is from [place] [facility] with plans to return after medical status improves. Pt is total care at baseline and is not at enough functional change to recommend SNF. Discussed with care team and will sign off at this time.   Doneta Public 10/18/2021, 1:29 PM

## 2021-10-18 NOTE — Care Management Important Message (Signed)
Important Message  Patient Details  Name: Sara Bailey MRN: [id number] Date of Birth: 09/05/1940   Medicare Important Message Given:  N/A - LOS <3 / Initial given by admissions     Dannette Barbara 10/18/2021, 8:56 AM

## 2021-10-18 NOTE — TOC Progression Note (Signed)
Transition of Care [facility]) - Progression Note    Patient Details  Name: Sara Bailey MRN: [id number] Date of Birth: 1940-11-27  Transition of Care [facility]) CM/SW [place],  Phone Number: 10/18/2021, 1:35 PM  Clinical Narrative:     Patient is LTC at [facility], plans to return at discharge once medically cleared. TOC continues to follow.        Expected Discharge Plan and Services                                                 Social Determinants of Health (SDOH) Interventions    Readmission Risk Interventions     No data to display

## 2021-10-18 NOTE — Assessment & Plan Note (Addendum)
Resolved.   Na initially was 153 - due to dehydration, free water deficit.  Improved with D5w infusion.

## 2021-10-19 DIAGNOSIS — E162 Hypoglycemia, unspecified: Secondary | ICD-10-CM | POA: Diagnosis not present

## 2021-10-19 LAB — GLUCOSE, CAPILLARY
Glucose-Capillary: 94 mg/dL (ref 70–99)
Glucose-Capillary: 84 mg/dL (ref 70–99)
Glucose-Capillary: 109 mg/dL — ABNORMAL HIGH (ref 70–99)
Glucose-Capillary: 133 mg/dL — ABNORMAL HIGH (ref 70–99)

## 2021-10-19 LAB — CULTURE, BLOOD (SINGLE)
Culture: NO GROWTH
Special Requests: ADEQUATE

## 2021-10-19 LAB — BASIC METABOLIC PANEL
Anion gap: 6 (ref 5–15)
BUN: 17 mg/dL (ref 8–23)
CO2: 29 mmol/L (ref 22–32)
Calcium: 9.4 mg/dL (ref 8.9–10.3)
Chloride: 105 mmol/L (ref 98–111)
Creatinine, Ser: 0.83 mg/dL (ref 0.44–1.00)
GFR, Estimated: >60 mL/min (ref >60)
Glucose, Bld: 136 mg/dL — ABNORMAL HIGH (ref 70–99)
Potassium: 4.3 mmol/L (ref 3.5–5.1)
Sodium: 140 mmol/L (ref 135–145)

## 2021-10-19 NOTE — Progress Notes (Signed)
Progress Note   Patient: Sara Bailey ZMO:[id number] DOB: 25-Aug-1940 DOA: 10/14/2021     4 DOS: the patient was seen and examined on 10/19/2021   Brief hospital course: Mrs. Sara Bailey was admitted to the hospital with the working diagnosis of altered mental status due to urinary tract infection, complicated with hypoglycemia.   81 yo female with the past medical history of coronary artery disease, CVA and hypertension who presented with altered mental status. Noted decline in her mentation over last few days prior to her transfer from the nursing home. On the day of her transfer she was found hypoglycemic and was decided to transfer her to the hospital. On her initial physical examination her blood pressure was 126/66, HR 47, RR 12 and 02 saturation 97,9. Lungs with no wheezing or rales, heart with S1 and S2 present and rhythmic, abdomen with no distention, no lower extremity edema.   NA 153, K 4,1 Cl 118 bicarbonate 39 glucose 342 bun 27 cr 1,35  Lactic acid 2,9  Wbc 5,7 hgb 11,1 plt 135  Urine analysis with SG 1,023, 21-50 wbc, 6-10 rbc  Urine culture >100,000 CFU aerococcus   Head CT with no acute changes  Chest radiograph with hyperinflation with no infiltrates.   EKG 42 bpm, left axis deviation, left anterior fascicular block, right bundle branch block, qtc 522, sinus rhythm with no significant ST segment or T wave changes.   Patient was placed on antibiotic therapy for urinary tract infection with good toleration.   Assessment and Plan: * Hypoglycemia Capillary glucose 90 and 107  Plan to continue IV dextrose. Patient with swallow dysfunction, continue with dysphagia 1 diet.  Aspiration precautions Continue capillary glucose monitoring.    Acute lower UTI Patient has completed antibiotic therapy with good toleration.   Arteriosclerotic dementia with depression [facility]) Patient with no agitation, continue with divalproex, donepezil, and fluoxetine.  Continue with memantine and  mirtazapine.  As needed lorazepam.  Coronary artery disease Continue with aspirin and atorvastatin Patient with no chest pain.   Essential hypertension Continue to hold on blood pressure agents due to risk of hypotension.   Hypernatremia Serum Na has been improving Follow up electrolytes in am Renal function has been stable with serum cr at 0,87   Hypothyroidism Continue with levothyroxine.   Pressure injury of skin    Pressure Ulcer  Duration          Pressure Injury 10/14/21 Sacrum Medial Stage 1 -  Intact skin with non-blanchable redness of a localized area usually over a bony prominence. Healing stage 1 with thick skin 3 days                  Subjective: Patient awake resting in bed.  She initially states she feels fine, but then was otherwise non-verbal for me.  No acute events reported.  Physical Exam: Vitals:   10/19/21 0750 10/19/21 1125 10/19/21 1128 10/19/21 1634  BP: (!) 142/61  (!) 161/64 131/71  Pulse: 60  73 81  Resp: 16 12 19 15   Temp:   98.7 F (37.1 C) 99.5 F (37.5 C)  TempSrc: Axillary  Axillary Axillary  SpO2: 94%  97% 100%  Weight:      Height:       General exam: awake, alert, no acute distress HEENT: moist mucus membranes, hearing grossly normal  Respiratory system: CTAB diminished bases, normal respiratory effort. Cardiovascular system: normal S1/S2, RRR,no pedal edema.   Gastrointestinal system: soft, NT, ND, no HSM felt, +bowel sounds.  Central nervous system: exam limited by dementia, pt does not follow commands and mostly non-verbal, no gross focal deficits Skin: dry, intact, normal temperature Psychiatry: difficult to assess as pt is non-verbal and not interactive due to dementia   Data Reviewed: Notable labs --  glucose 136, CBG's 94 >> 84 >> 109   Family Communication: no family at the bedside on rounds, will attempt to call  Disposition: Status is: Inpatient Remains inpatient appropriate because: hypoglycemia    Planned Discharge Destination: Home   Author: Ezekiel Slocumb, DO 10/19/2021 6:03 PM  For on call review [email or web address].

## 2021-10-19 NOTE — Progress Notes (Signed)
[facility] Hospital Liaison note: ? ?This patient is currently enrolled in [facility] outpatient-based Palliative Care. Will continue to follow for disposition. ? ?Please call with any outpatient palliative questions or concerns. ? ?Thank you, ?Dee Curry, LPN ?[facility] Hospital Liaison ?[phone] ?

## 2021-10-20 DIAGNOSIS — E162 Hypoglycemia, unspecified: Secondary | ICD-10-CM | POA: Diagnosis not present

## 2021-10-20 DIAGNOSIS — E440 Moderate protein-calorie malnutrition: Secondary | ICD-10-CM | POA: Diagnosis present

## 2021-10-20 LAB — BASIC METABOLIC PANEL
Anion gap: 3 — ABNORMAL LOW (ref 5–15)
BUN: 12 mg/dL (ref 8–23)
CO2: 27 mmol/L (ref 22–32)
Calcium: 9.7 mg/dL (ref 8.9–10.3)
Chloride: 108 mmol/L (ref 98–111)
Creatinine, Ser: 0.68 mg/dL (ref 0.44–1.00)
GFR, Estimated: >60 mL/min (ref >60)
Glucose, Bld: 142 mg/dL — ABNORMAL HIGH (ref 70–99)
Potassium: 4 mmol/L (ref 3.5–5.1)
Sodium: 138 mmol/L (ref 135–145)

## 2021-10-20 LAB — GLUCOSE, CAPILLARY
Glucose-Capillary: 114 mg/dL — ABNORMAL HIGH (ref 70–99)
Glucose-Capillary: 105 mg/dL — ABNORMAL HIGH (ref 70–99)
Glucose-Capillary: 122 mg/dL — ABNORMAL HIGH (ref 70–99)

## 2021-10-20 MED ORDER — ADULT MULTIVITAMIN W/MINERALS CH
1.0000 | ORAL_TABLET | Freq: Every day | ORAL | Status: DC
  Filled 2021-10-20 (×2): qty 1

## 2021-10-20 NOTE — Assessment & Plan Note (Signed)
Related to chronic illnesses including prior strokes and dementia, as evidenced by fat and muscle depletion, weight loss --Appreciate dietitian recommendations -- Started on supplement drinks vitamins.

## 2021-10-20 NOTE — Evaluation (Signed)
Clinical/Bedside Swallow Evaluation Patient Details  Name: Sara Bailey MRN: [id number] Date of Birth: 25-Oct-1940  Today's Date: 10/20/2021 Time: SLP Start Time (ACUTE ONLY): 9381 SLP Stop Time (ACUTE ONLY): 0945 SLP Time Calculation (min) (ACUTE ONLY): 11 min  Past Medical History:  Past Medical History:  Diagnosis Date   Coronary artery disease    Hypertension    Stroke [facility])    Past Surgical History: No past surgical history on file. HPI:  81 yo female with the past medical history of coronary artery disease, CVA and hypertension who presented with altered mental status. Noted decline in her mentation over last few days prior to her transfer from the nursing home. On the day of her transfer she was found hypoglycemic and was decided to transfer her to the hospital on 10/15/2021. Working diagnosis of altered mental status due to urinary tract infection, complicated with hypoglycemia. At baseline, pt was bedriddened and required total assist for all care. On admission pt's chest x-ray was negative for acute cardiopulmonary disease. However, pt had a change in mental status with repeat chest x-ray revealing Subtle hazy opacity at the right lung base developed since the prior exam. Consider pneumonia if there are consistent clinical  findings. Head CT was stable noncontrast head CT with no acute intracranial pathology.    Assessment / Plan / Recommendation  Clinical Impression  ST services were consulted d/t concern for increased drooling, spitting out and pushing boluses out of her mouth.   Pt presents with cognition based oropharyngeal dysphagia. As such, her ability to consume POs is likely to fluctuate greatly. When consuming thin liquids via spoon, ice cream and applesauce, pt demonstrates rudimentary oral phase c/b by sucking on boluses and likely lingual pumping to facilitate posterior transfer of bolus. While this prolongs pt's oral phase, her pharyngeal phase is free of any overt s/s  of aspiration. Unfortunately, diet modification will not be able to reduce pt's aspiration risks or re mediate the above mentioned behaviors. Current diet appears appropriate with recommendation for Palliative Care consult in the setting of pt's neurodegenerative diagnoses.       Of note, pt was laying on her right side during this assessment as she was not able to elongate herself for an upright position. Unknown pt preference but side laying while consuming PO's might have resulted in her right sided lung issues identified on most recent chest x-ray. SLP Visit Diagnosis: Dysphagia, oropharyngeal phase (R13.12)    Aspiration Risk  Risk for inadequate nutrition/hydration;Moderate aspiration risk    Diet Recommendation Dysphagia 1 (Puree);Thin liquid   Liquid Administration via: Spoon Medication Administration: Crushed with puree Supervision: Full supervision/cueing for compensatory strategies Compensations: Minimize environmental distractions;Slow rate;Small sips/bites Postural Changes: Seated upright at 90 degrees    Other  Recommendations Oral Care Recommendations: Oral care BID    Recommendations for follow up therapy are one component of a multi-disciplinary discharge planning process, led by the attending physician.  Recommendations may be updated based on patient status, additional functional criteria and insurance authorization.  Follow up Recommendations No SLP follow up      Assistance Recommended at Discharge None  Functional Status Assessment Patient has not had a recent decline in their functional status         Prognosis Prognosis for Safe Diet Advancement:  (poor) Barriers to Reach Goals: Cognitive deficits;Severity of deficits      Swallow Study   General Date of Onset: 10/15/21 HPI: 81 yo female with the past medical history of coronary  artery disease, CVA and hypertension who presented with altered mental status. Noted decline in her mentation over last few days  prior to her transfer from the nursing home. On the day of her transfer she was found hypoglycemic and was decided to transfer her to the hospital on 10/15/2021. Working diagnosis of altered mental status due to urinary tract infection, complicated with hypoglycemia. At baseline, pt was bedriddened and required total assist for all care. On admission pt's chest x-ray was negative for acute cardiopulmonary disease. However, pt had a change in mental status with repeat chest x-ray revealing Subtle hazy opacity at the right lung base developed since the prior exam. Consider pneumonia if there are consistent clinical  findings. Head CT was stable noncontrast head CT with no acute intracranial pathology. Type of Study: Bedside Swallow Evaluation Previous Swallow Assessment: none in chart Diet Prior to this Study: Dysphagia 1 (puree);Thin liquids Temperature Spikes Noted: No Respiratory Status: Room air History of Recent Intubation: No Behavior/Cognition: Doesn't follow directions (pt was awake, remained in fetal position, unable to follow directions) Oral Cavity Assessment: Within Functional Limits Oral Care Completed by SLP: No Self-Feeding Abilities: Total assist Patient Positioning: Postural control adequate for testing Baseline Vocal Quality: Not observed Volitional Cough: Cognitively unable to elicit Volitional Swallow: Unable to elicit    Oral/Motor/Sensory Function Overall Oral Motor/Sensory Function:  (grossly adequate when consuming ice cream and puree)   Ice Chips Ice chips: Not tested   Thin Liquid Thin Liquid: Impaired Presentation: Spoon Oral Phase Impairments: Reduced lingual movement/coordination;Reduced labial seal Oral Phase Functional Implications: Prolonged oral transit;Oral residue;Right anterior spillage    Nectar Thick Nectar Thick Liquid: Not tested   Honey Thick Honey Thick Liquid: Not tested   Puree Puree: Impaired Presentation: Spoon Oral Phase Impairments: Reduced  lingual movement/coordination Oral Phase Functional Implications: Right anterior spillage;Oral residue;Prolonged oral transit   Solid     Solid: Not tested     Basheer Molchan B. Dreama Saa, M.S., CCC-SLP, Tree surgeon Certified Brain Injury Specialist [facility]  [facility] Rehabilitation Services Office [phone] Ascom [phone] Fax [phone]

## 2021-10-20 NOTE — Progress Notes (Signed)
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  GOAL:   Patient will meet greater than or equal to 90% of their needs  MONITOR:   PO intake, Supplement acceptance  REASON FOR ASSESSMENT:   Low Braden    ASSESSMENT:   Pt with medical history significant for coronary artery disease, hypertension and CVA, who presented with acute onset of decreased responsiveness at her [facility] skilled nursing facility where she resides  Pt admitted with hypoglycemia and acute lower UTI.   Reviewed I/O's: -1.6 L x 24 hours and +1.1 L since admission  UOP: 1.6 L x 24 hours   Pt lying in bed at time of visit. She was not responsive to voice or touch. No family present to provide additional history.   Case discussed with SLP; pt remains unable to consume adequate PO to sustain life and would not benefit from a diet modification. Plan for palliative care consult. Would not recommend a feeding tube due to dementia and advanced age.   Noted meal completions 0-50%. Suspect poor oral intake PTA given pt's malnutrition.   Reviewed wt hx; pt has experienced a 15.9% wt loss over the past 3 months, which is significant for time frame.   Medications reviewed and include remeron and senokot.   Lab Results  Component Value Date   HGBA1C 6.6 (H) 07/10/2020   PTA DM medications are none.   Labs reviewed: CBGS: 84-133 (inpatient orders for glycemic control are none).    NUTRITION - FOCUSED PHYSICAL EXAM:  Flowsheet Row Most Recent Value  Orbital Region No depletion  Upper Arm Region Mild depletion  Thoracic and Lumbar Region Mild depletion   Buccal Region Mild depletion  Temple Region No depletion  Clavicle Bone Region Mild depletion  Clavicle and Acromion Bone Region Mild depletion  Scapular Bone Region Mild depletion  Dorsal Hand Mild depletion  Patellar Region Mild depletion  Anterior Thigh Region Mild depletion  Posterior Calf Region Mild depletion  Edema (RD Assessment) None  Hair Reviewed  Eyes Reviewed  Mouth Reviewed  Skin Reviewed  Nails Reviewed       Diet Order:   Diet Order             DIET - DYS 1 Room service appropriate? Yes; Fluid consistency: Thin  Diet effective now                   EDUCATION NEEDS:   No education needs have been identified at this time  Skin:  Skin Assessment: Skin Integrity Issues: Skin Integrity Issues:: Stage I Stage I: sacrum  Last BM:  10/17/21  Height:   Ht Readings from Last 1 Encounters:  10/14/21 5\' 7"  (1.702 m)    Weight:   Wt Readings from Last 1 Encounters:  10/14/21 48.8 kg    Ideal Body Weight:  61.4 kg  BMI:  Body mass index is 16.85 kg/m.  Estimated Nutritional Needs:   Kcal:  1500-1700  Protein:  80-95 grams  Fluid:  > 1.5 L    Loistine Chance, RD, LDN, [place] Registered Dietitian II Certified Diabetes Care and Education Specialist Please refer to [facility] for RD and/or RD on-call/weekend/after hours pager

## 2021-10-20 NOTE — Progress Notes (Signed)
Progress Note   Patient: Lamyiah Crawshaw VQM:[id number] DOB: 10-Nov-1940 DOA: 10/14/2021     5 DOS: the patient was seen and examined on 10/20/2021   Brief hospital course: Mrs. Retana was admitted to the hospital with the working diagnosis of altered mental status due to urinary tract infection, complicated with hypoglycemia.   81 yo female with the past medical history of coronary artery disease, CVA and hypertension who presented with altered mental status. Noted decline in her mentation over last few days prior to her transfer from the nursing home. On the day of her transfer she was found hypoglycemic and was decided to transfer her to the hospital. On her initial physical examination her blood pressure was 126/66, HR 47, RR 12 and 02 saturation 97,9. Lungs with no wheezing or rales, heart with S1 and S2 present and rhythmic, abdomen with no distention, no lower extremity edema.   NA 153, K 4,1 Cl 118 bicarbonate 39 glucose 342 bun 27 cr 1,35  Lactic acid 2,9  Wbc 5,7 hgb 11,1 plt 135  Urine analysis with SG 1,023, 21-50 wbc, 6-10 rbc  Urine culture >100,000 CFU aerococcus   Head CT with no acute changes  Chest radiograph with hyperinflation with no infiltrates.   EKG 42 bpm, left axis deviation, left anterior fascicular block, right bundle branch block, qtc 522, sinus rhythm with no significant ST segment or T wave changes.   Patient was placed on antibiotic therapy for urinary tract infection and clinically improved.   Hospital course complicated by hypoglycemia requiring dextrose IV fluids, in the setting of very poor PO intake.     Assessment and Plan: * Hypoglycemia CBG's have improved on D5W infusion, but patient has ongoing poor PO intake (minimal).    --Stop D5W and monitor for recurrent hypoglycemic episodes --SLP for swallow evaluation --Palliative consult given underlying dementia which seems advanced  --On dysphagia 1 diet --Aspiration precautions --Monitor  CBG's  Acute lower UTI Patient has completed antibiotic therapy with clinical improvement.    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 -- Continue with divalproex, donepezil, fluoxetine, memantine and mirtazapine.  --As needed lorazepam.  Coronary artery disease Continue with aspirin and atorvastatin Patient with no chest pain.   Essential hypertension Continue to hold on blood pressure agents due to risk of hypotension.   Hypernatremia Resolved.   Na initially was 153 Improved with D5w infusion. --Monitor BMP  Hypothyroidism Continue with levothyroxine.   Pressure injury of skin    Pressure Ulcer  Duration          Pressure Injury 10/14/21 Sacrum Medial Stage 1 -  Intact skin with non-blanchable redness of a localized area usually over a bony prominence. Healing stage 1 with thick skin 3 days            Malnutrition of moderate degree Related to chronic illnesses including prior strokes and dementia, as evidenced by fat and muscle depletion, weight loss --Appreciate dietitian recommendations -- Started on supplement drinks vitamins.        Subjective: Patient awake resting in bed.  She was completely non-verbal for me today.  No acute events reported.  Ongoing poor PO intake.  Seen by SLP this AM.  Physical Exam: Vitals:   10/20/21 0008 10/20/21 0350 10/20/21 0753 10/20/21 1147  BP: 122/72 133/80 126/69 (!) 146/68  Pulse: 85 68 66 86  Resp: 18 18 18 18   Temp: 98.5 F (36.9 C) 99 F (37.2 C) 98.2 F (36.8 C) 98.4 F (  36.9 C)  TempSrc: Rectal Axillary    SpO2: 98% 98% 100% 100%  Weight:      Height:       General exam: awake, alert, no acute distress HEENT: moist mucus membranes, hearing grossly normal  Respiratory system: CTAB diminished bases, normal respiratory effort. Cardiovascular system: normal S1/S2, RRR,no pedal edema.   Gastrointestinal system: soft, NT, ND Central nervous system: exam limited  by dementia, pt does not follow commands and is non-verbal Skin: dry, intact, normal temperature Psychiatry: difficult to assess as pt is non-verbal and not interactive due to dementia   Data Reviewed: Notable labs --  glucose 142.  CBG's 94 >> 84 >> 109 >> 133 >> 114   Family Communication: no family at the bedside on rounds, will attempt to call  Disposition: Status is: Inpatient Remains inpatient appropriate because: hypoglycemia   Planned Discharge Destination: Home   Author: Ezekiel Slocumb, DO 10/20/2021 3:33 PM  For on call review [email or web address].

## 2021-10-21 DIAGNOSIS — N3000 Acute cystitis without hematuria: Secondary | ICD-10-CM | POA: Diagnosis not present

## 2021-10-21 DIAGNOSIS — R627 Adult failure to thrive: Secondary | ICD-10-CM

## 2021-10-21 DIAGNOSIS — Z7189 Other specified counseling: Secondary | ICD-10-CM

## 2021-10-21 DIAGNOSIS — E440 Moderate protein-calorie malnutrition: Secondary | ICD-10-CM | POA: Diagnosis not present

## 2021-10-21 DIAGNOSIS — E162 Hypoglycemia, unspecified: Secondary | ICD-10-CM | POA: Diagnosis not present

## 2021-10-21 LAB — GLUCOSE, CAPILLARY
Glucose-Capillary: 78 mg/dL (ref 70–99)
Glucose-Capillary: 87 mg/dL (ref 70–99)
Glucose-Capillary: 70 mg/dL (ref 70–99)

## 2021-10-21 MED ORDER — GLYCOPYRROLATE 0.2 MG/ML IJ SOLN: 0.2000 mg | INTRAMUSCULAR | Status: DC | PRN

## 2021-10-21 MED ORDER — GLYCOPYRROLATE 1 MG PO TABS: 1.0000 mg | ORAL_TABLET | ORAL | Status: DC | PRN

## 2021-10-21 MED ORDER — LORAZEPAM 2 MG/ML PO CONC: 1.0000 mg | ORAL | Status: DC | PRN

## 2021-10-21 MED ORDER — HALOPERIDOL LACTATE 5 MG/ML IJ SOLN: 2.0000 mg | INTRAMUSCULAR | Status: DC | PRN

## 2021-10-21 MED ORDER — HALOPERIDOL 2 MG PO TABS: 2.0000 mg | ORAL_TABLET | ORAL | Status: DC | PRN

## 2021-10-21 MED ORDER — LORAZEPAM 2 MG/ML IJ SOLN: 1.0000 mg | INTRAMUSCULAR | Status: DC | PRN

## 2021-10-21 MED ORDER — HALOPERIDOL LACTATE 2 MG/ML PO CONC: 2.0000 mg | ORAL | Status: DC | PRN

## 2021-10-21 MED ORDER — BIOTENE DRY MOUTH MT LIQD: 15.0000 mL | Freq: Three times a day (TID) | OROMUCOSAL | Status: DC

## 2021-10-21 MED ORDER — LORAZEPAM 1 MG PO TABS: 1.0000 mg | ORAL_TABLET | ORAL | Status: DC | PRN

## 2021-10-21 MED ORDER — POLYVINYL ALCOHOL 1.4 % OP SOLN: 1.0000 [drp] | Freq: Four times a day (QID) | OPHTHALMIC | Status: DC | PRN

## 2021-10-21 MED ORDER — MORPHINE SULFATE (PF) 2 MG/ML IV SOLN: 1.0000 mg | INTRAVENOUS | Status: DC | PRN

## 2021-10-21 NOTE — Progress Notes (Signed)
Pt has refused all her night meds. She did not take the oral ones, and so Lovenox and Eye drops were offered to be administered and she denied. See [facility]

## 2021-10-21 NOTE — Consult Note (Signed)
Consultation Note Date: 10/21/2021   Patient Name: Sara Bailey  DOB: [birth year]  MRN: [id number]  Age / Sex: 81 y.o., female  PCP: Alvester Morin, MD Referring Physician: Ezekiel Slocumb, DO  Reason for Consultation: Establishing goals of care  HPI/Patient Profile: 81 y.o. female  with past medical history of CAD, hypertension, and CVA admitted on 10/14/2021 with AMS.  Found to have UTI which has been treated.  Unfortunately continues with altered mental status and very poor p.o. intake.  Patient having episodes of hypoglycemia throughout hospitalization.  PMT consulted to discuss goals of care with family.  Clinical Assessment and Goals of Care: I have reviewed medical records including EPIC notes, labs and imaging, received report from RN, assessed the patient and then met with patient's brother who is HCPOA as well as sister-in-law to discuss diagnosis prognosis, GOC, EOL wishes, disposition and options.  I introduced Palliative Medicine as specialized medical care for people living with serious illness. It focuses on providing relief from the symptoms and stress of a serious illness. The goal is to improve quality of life for both the patient and the family.  We discussed a brief life review of the patient.  Patient's brother tells me about their relationship growing up.  They are the youngest of 4 children.  As far as functional and nutritional status they tell me of a decline over the past year.  Did tell me she has been living at [facility] for over a year and has been nonambulatory there.  They tell me of poor p.o. intake and weight loss there.  They tell me she has been nonverbal.   We discussed patient's current illness and what it means in the larger context of patient's on-going co-morbidities.  Natural disease trajectory and expectations at EOL were discussed.  We discussed her overall failure to thrive and ongoing poor intake  with aspiration risk.  I attempted to elicit values and goals of care important to the patient.    The difference between aggressive medical intervention and comfort care was considered in light of the patient's goals of care.  Family agrees patient would want to transition to focus on comfort and avoid aggressive medical interventions.  Discussed with family the importance of continued conversation with family and the medical providers regarding overall plan of care and treatment options, ensuring decisions are within the context of the patients values and GOCs.    Hospice and Palliative Care services outpatient were explained and offered.  Family is interested in the support of hospice.  We discussed her going back to her long-term care facility that she is familiar with with the support of hospice care versus going to a hospice facility.  After thorough discussion of each option family feels patient would want to go back to long-term care with hospice to follow there.  Questions and concerns were addressed. The family was encouraged to call with questions or concerns.  Primary Decision Maker HCPOA -brother Suezanne Jacquet    SUMMARY OF RECOMMENDATIONS   -Transition care to focus on comfort and avoid aggressive medical interventions -Family elects to go back to her long-term care facility ([facility]) with the support of hospice to follow their -No repeat hospitalizations  Code Status/Advance Care Planning: DNR  Additional Recommendations (Limitations, Scope, Preferences): Full Comfort Care  Prognosis:  < 2 weeks  Discharge Planning: [place] with Hospice      Primary Diagnoses: Present on Admission:  Hypoglycemia  Essential hypertension  Hypothyroidism  Coronary artery disease  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 The  following aspects are pertinent.  Past Medical History:  Diagnosis Date   Coronary artery disease    Hypertension    Stroke [facility])    Social History   Socioeconomic History   Marital status: Single    Spouse name: Not on file   Number of children: Not on file   Years of education: Not on file   Highest education level: Not on file  Occupational History   Not on file  Tobacco Use   Smoking status: Former    Types: Cigarettes    Quit date: 2019    Years since quitting: 4.7   Smokeless tobacco: Never  Vaping Use   Vaping Use: Never used  Substance and Sexual Activity   Alcohol use: Never   Drug use: Never   Sexual activity: Not on file  Other Topics Concern   Not on file  Social History Narrative   Not on file   Social Determinants of Health   Financial Resource Strain: Not on file  Food Insecurity: Not on file  Transportation Needs: Not on file  Physical Activity: Not on file  Stress: Not on file  Social Connections: Not on file   Family History  Problem Relation Age of Onset   Stroke Father    Heart attack Brother    Scheduled Meds:  atorvastatin  80 mg Oral QHS   divalproex  125 mg Oral q morning   divalproex  250 mg Oral QHS   donepezil  10 mg Oral QHS   dorzolamide  1 drop Both Eyes TID   enoxaparin (LOVENOX) injection  40 mg Subcutaneous Q24H   feeding supplement  237 mL Oral BID BM   FLUoxetine  10 mg Oral Daily   latanoprost  1 drop Both Eyes QHS   memantine  10 mg Oral BID   mirtazapine  15 mg Oral QHS   multivitamin with minerals  1 tablet Oral Daily   senna-docusate  1 tablet Oral BID   Continuous Infusions: PRN Meds:.acetaminophen **OR** acetaminophen, LORazepam, magnesium hydroxide, ondansetron **OR** ondansetron (ZOFRAN) IV, traZODone No Known Allergies Review of Systems  Physical Exam  Vital Signs: BP 118/65 (BP Location: Left Arm)   Pulse 73   Temp 98.2 F (36.8 C)   Resp 19   Ht '5\' 7"'  (1.702 m) Comment: stated by the patient   Wt 48.8 kg   SpO2 100%   BMI 16.85 kg/m  Pain Scale: PAINAD POSS *See Group Information*: 1-Acceptable,Awake and alert Pain Score: 0-No pain   SpO2: SpO2: 100 % O2 Device:SpO2: 100 % O2 Flow Rate: .O2 Flow Rate (L/min): 2 L/min  IO: Intake/output summary:  Intake/Output Summary (Last 24 hours) at 10/21/2021 1530 Last data filed at 10/20/2021 2107 Gross per 24 hour  Intake 0 ml  Output 950 ml  Net -950 ml    LBM: Last BM Date : 10/17/21 Baseline Weight: Weight: 53.1 kg (bed weight) Most recent weight: Weight: 48.8 kg     Palliative Assessment/Data: PPS 20%     *Please note that this is a verbal dictation therefore any spelling or grammatical errors are due to the "[employer]" system interpretation.  Juel Burrow, DNP, AGNP-C Palliative Medicine Team [phone] Pager: [phone]

## 2021-10-21 NOTE — Progress Notes (Signed)
Pt sleepy/drowsy at start of shift but even with drowsiness, she is able to respond to me.

## 2021-10-21 NOTE — TOC Progression Note (Signed)
Transition of Care [facility]) - Progression Note    Patient Details  Name: Sara Bailey MRN: [id number] Date of Birth: February 05, 1940  Transition of Care [facility]) CM/SW [place], [place] Phone Number: 10/21/2021, 4:13 PM  Clinical Narrative:      CSW notes plan to return to [place] with [facility]. Per Audry Pili at [facility] they can accept patient back tomorrow morning, treatment team aware.       Expected Discharge Plan and Services                                                 Social Determinants of Health (SDOH) Interventions    Readmission Risk Interventions     No data to display

## 2021-10-21 NOTE — Progress Notes (Addendum)
Manufacturing engineer [facility]) Hospital Liaison Note   Received request from Transitions of Care Manager, Caryl Pina , for hospice services at [place] after discharge. Chart and patient information under review by [facility] physician. Hospice eligibility approved.   Spoke with brother/Ben to initiate education related to hospice philosophy, services, and team approach to care. Suezanne Jacquet reports that he will call MSW back shortly to discuss full extent of hospice services once he returns home and any additional questions as he is having a hard time hearing this Probation officer.   Patient will return to [place] on 9.22 via [facility].    Please send signed and completed DNR home with patient/family. Please provide prescriptions at discharge as needed to ensure ongoing symptom management.    [facility] information and contact numbers given to family & above information shared with TOC.   Please call with any questions/concerns.    Thank you for the opportunity to participate in this patient's care.   Daphene Calamity, MSW [facility] Liaison  [phone]

## 2021-10-21 NOTE — Progress Notes (Signed)
Progress Note   Patient: Sara Bailey [id number] DOB: [birth year] DOA: 10/14/2021     6 DOS: the patient was seen and examined on 10/21/2021   Brief hospital course: Mrs. Murrell was admitted to the hospital with the working diagnosis of altered mental status due to urinary tract infection, complicated with hypoglycemia.   81 yo female with the past medical history of coronary artery disease, CVA and hypertension who presented with altered mental status. Noted decline in her mentation over last few days prior to her transfer from the nursing home. On the day of her transfer she was found hypoglycemic and was decided to transfer her to the hospital. On her initial physical examination her blood pressure was 126/66, HR 47, RR 12 and 02 saturation 97,9. Lungs with no wheezing or rales, heart with S1 and S2 present and rhythmic, abdomen with no distention, no lower extremity edema.   NA 153, K 4,1 Cl 118 bicarbonate 39 glucose 342 bun 27 cr 1,35  Lactic acid 2,9  Wbc 5,7 hgb 11,1 plt 135  Urine analysis with SG 1,023, 21-50 wbc, 6-10 rbc  Urine culture >100,000 CFU aerococcus   Head CT with no acute changes  Chest radiograph with hyperinflation with no infiltrates.   EKG 42 bpm, left axis deviation, left anterior fascicular block, right bundle branch block, qtc 522, sinus rhythm with no significant ST segment or T wave changes.   Patient was placed on antibiotic therapy for urinary tract infection and clinically improved.   Hospital course complicated by hypoglycemia requiring dextrose IV fluids, in the setting of very poor PO intake.     Assessment and Plan: * Hypoglycemia CBG's have improved on D5W infusion, but patient has ongoing poor PO intake (minimal).    9/20: AM CBG 78 >> 87 --Off D5W and monitoring for recurrent hypoglycemic episodes --SLP for swallow evaluation --Palliative consult given underlying dementia which seems advanced  --On dysphagia 1 diet --Aspiration  precautions --Monitor CBG's  Acute lower UTI Patient has completed antibiotic therapy with clinical improvement.    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 -- Continue with divalproex, donepezil, fluoxetine, memantine and mirtazapine.  --As needed lorazepam.  Coronary artery disease Continue with aspirin and atorvastatin Patient with no chest pain.   Essential hypertension Continue to hold on blood pressure agents due to risk of hypotension.   Hypernatremia Resolved.   Na initially was 153 Improved with D5w infusion. --Monitor BMP  Hypothyroidism Continue with levothyroxine.   Pressure injury of skin    Pressure Ulcer  Duration          Pressure Injury 10/14/21 Sacrum Medial Stage 1 -  Intact skin with non-blanchable redness of a localized area usually over a bony prominence. Healing stage 1 with thick skin 3 days            Malnutrition of moderate degree Related to chronic illnesses including prior strokes and dementia, as evidenced by fat and muscle depletion, weight loss --Appreciate dietitian recommendations -- Started on supplement drinks vitamins.        Subjective: Patient was sleeping comfortably, woke up briefly to voice.  Did not interactive or speak at all.  No acute events reported.  Physical Exam: Vitals:   10/20/21 2015 10/20/21 2328 10/21/21 0429 10/21/21 0741  BP: 139/67 122/66 130/71 118/65  Pulse: 84 98 78 73  Resp: 18 18 18 19   Temp: 99.3 F (37.4 C) 100.1 F (37.8 C) 97.8 F (36.6 C) 98.2 F (36.8 C)  TempSrc:   Oral   SpO2: 100% 98% 100% 100%  Weight:      Height:       General exam: awake, wakes to voice but non-verbal and not interactive, no acute distress HEENT: moist mucus membranes, hearing grossly normal  Respiratory system: CTAB diminished bases, normal respiratory effort. Cardiovascular system: normal S1/S2, RRR,no pedal edema.   Gastrointestinal system: soft, NT,  ND Central nervous system: exam limited by dementia, pt does not follow commands and is non-verbal Skin: dry, intact, normal temperature Psychiatry: difficult to assess as pt is non-verbal and not interactive due to dementia   Data Reviewed: Notable labs --  CBG's 114 >> 105 >> 122 >> 78 >> 87   Family Communication: sister and brother updated at bedside this afternoon.  Discussed overall prognosis given inadequate PO intake, dementia. Patient is appropriate for hospice with life expectancy under 6 months from standpoint of nutrition alone. Palliative Care to meet with them this afternoon as well.    Disposition: Status is: Inpatient Remains inpatient appropriate because: inadequate PO intake, hypoglycemia, ongoing Hana discussions  Planned Discharge Destination: Home   Author: Ezekiel Slocumb, DO 10/21/2021 2:20 PM  For on call review [email or web address].

## 2021-10-21 NOTE — H&P (Deleted)
Manufacturing engineer [facility]) Hospital Liaison Note   Received request from Transitions of Care Manager, Caryl Pina , for hospice services at [place] after discharge. Chart and patient information under review by [facility] physician. Hospice eligibility approved.   Spoke with brother/Ben to initiate education related to hospice philosophy, services, and team approach to care. Suezanne Jacquet reports that he will call MSW back shortly to discuss full extent of hospice services once he returns home and any additional questions.   Patient will return to [facility] on 9.22 via [facility].    Please send signed and completed DNR home with patient/family. Please provide prescriptions at discharge as needed to ensure ongoing symptom management.    [facility] information and contact numbers given to family & above information shared with TOC.   Please call with any questions/concerns.    Thank you for the opportunity to participate in this patient's care.   Daphene Calamity, MSW [facility] Liaison  [phone]

## 2021-10-21 NOTE — Care Management Important Message (Signed)
Important Message  Patient Details  Name: Sara Bailey MRN: [id number] Date of Birth: Feb 12, 1940   Medicare Important Message Given:  Yes     Dannette Barbara 10/21/2021, 2:06 PM

## 2021-10-22 ENCOUNTER — Encounter: Payer: Self-pay | Admitting: Family Medicine

## 2021-10-22 DIAGNOSIS — Z515 Encounter for palliative care: Secondary | ICD-10-CM

## 2021-10-22 DIAGNOSIS — E440 Moderate protein-calorie malnutrition: Secondary | ICD-10-CM

## 2021-10-22 DIAGNOSIS — Z66 Do not resuscitate: Secondary | ICD-10-CM

## 2021-10-22 DIAGNOSIS — E162 Hypoglycemia, unspecified: Secondary | ICD-10-CM | POA: Diagnosis not present

## 2021-10-22 LAB — GLUCOSE, CAPILLARY
Glucose-Capillary: 80 mg/dL (ref 70–99)
Glucose-Capillary: 77 mg/dL (ref 70–99)

## 2021-10-22 MED ORDER — ENSURE ENLIVE PO LIQD: 237.0000 mL | Freq: Two times a day (BID) | ORAL | 12 refills | Status: AC

## 2021-10-22 MED ORDER — LORAZEPAM 2 MG/ML PO CONC: 1.0000 mg | ORAL | 0 refills | Status: AC | PRN

## 2021-10-22 MED ORDER — HALOPERIDOL 2 MG PO TABS: 2.0000 mg | ORAL_TABLET | ORAL | Status: AC | PRN

## 2021-10-22 MED ORDER — ACETAMINOPHEN 650 MG RE SUPP: 650.0000 mg | Freq: Four times a day (QID) | RECTAL | 0 refills | Status: AC | PRN

## 2021-10-22 MED ORDER — HALOPERIDOL LACTATE 2 MG/ML PO CONC: 2.0000 mg | ORAL | 0 refills | Status: AC | PRN

## 2021-10-22 MED ORDER — ONDANSETRON HCL 4 MG PO TABS: 4.0000 mg | ORAL_TABLET | Freq: Four times a day (QID) | ORAL | 0 refills | Status: AC | PRN

## 2021-10-22 MED ORDER — POLYVINYL ALCOHOL 1.4 % OP SOLN: 1.0000 [drp] | Freq: Four times a day (QID) | OPHTHALMIC | 0 refills | Status: AC | PRN

## 2021-10-22 MED ORDER — LORAZEPAM 1 MG PO TABS: 1.0000 mg | ORAL_TABLET | ORAL | 0 refills | Status: AC | PRN

## 2021-10-22 MED ORDER — BIOTENE DRY MOUTH MT LIQD: 15.0000 mL | Freq: Three times a day (TID) | OROMUCOSAL | Status: AC

## 2021-10-22 MED ORDER — GLYCOPYRROLATE 1 MG PO TABS: 1.0000 mg | ORAL_TABLET | ORAL | Status: AC | PRN

## 2021-10-22 MED ORDER — ACETAMINOPHEN 325 MG PO TABS: 650.0000 mg | ORAL_TABLET | Freq: Four times a day (QID) | ORAL | Status: AC | PRN

## 2021-10-22 MED ORDER — GLYCOPYRROLATE 0.2 MG/ML IJ SOLN: 0.2000 mg | INTRAMUSCULAR | Status: AC | PRN

## 2021-10-22 NOTE — TOC Transition Note (Signed)
Transition of Care [facility]) - CM/SW Discharge Note   Patient Details  Name: Sara Bailey MRN: [id number] Date of Birth: 01-Feb-1940  [facility] CM/SW Contact:  Alberteen Sam, LCSW Phone Number: 10/22/2021, 9:17 AM   Clinical Narrative:     Patient will DC to: [facility] Anticipated DC date: 10/22/21 Family notified: brother Suezanne Jacquet Transport QB:[id number]  Per MD patient ready for DC to [facility] . RN, patient, patient's family, and facility notified of DC. Discharge Summary sent to facility. RN given number for report  [phone]. DC packet on chart. Ambulance transport requested for patient.  CSW signing off.  Pricilla Riffle, LCSW    Final next level of care: Skilled Nursing Facility Barriers to Discharge: No Barriers Identified   Patient Goals and CMS Choice Patient states their goals for this hospitalization and ongoing recovery are:: to go home CMS Medicare.gov Compare Post Acute Care list provided to:: Patient Choice offered to / list presented to : Patient  Discharge Placement                       Discharge Plan and Services                                     Social Determinants of Health (SDOH) Interventions     Readmission Risk Interventions     No data to display

## 2021-10-22 NOTE — Progress Notes (Signed)
Daily Progress Note   Patient Name: Laura Caldas       Date: 10/22/2021 DOB: Oct 01, 1940  Age: 81 y.o. MRN#: [id number] Attending Physician: Ezekiel Slocumb, DO Primary Care Physician: Alvester Morin, MD Admit Date: 10/14/2021  Reason for Consultation/Follow-up: Terminal Care  Subjective: Patient unresponsive, no family at bedside Nursing staff at bedside reports no p.o. intake  Length of Stay: 7  Current Medications: Scheduled Meds:   antiseptic oral rinse  15 mL Topical TID   atorvastatin  80 mg Oral QHS   divalproex  125 mg Oral q morning   divalproex  250 mg Oral QHS   donepezil  10 mg Oral QHS   dorzolamide  1 drop Both Eyes TID   enoxaparin (LOVENOX) injection  40 mg Subcutaneous Q24H   feeding supplement  237 mL Oral BID BM   FLUoxetine  10 mg Oral Daily   latanoprost  1 drop Both Eyes QHS   memantine  10 mg Oral BID   mirtazapine  15 mg Oral QHS   multivitamin with minerals  1 tablet Oral Daily   senna-docusate  1 tablet Oral BID    Continuous Infusions:   PRN Meds: acetaminophen **OR** acetaminophen, glycopyrrolate **OR** glycopyrrolate **OR** glycopyrrolate, haloperidol **OR** haloperidol **OR** haloperidol lactate, LORazepam **OR** LORazepam **OR** LORazepam, magnesium hydroxide, morphine injection, ondansetron **OR** ondansetron (ZOFRAN) IV, polyvinyl alcohol, traZODone  Physical Exam Constitutional:      General: She is not in acute distress.    Appearance: She is ill-appearing.     Comments: Eyes open but does not respond to verbal or physical stimulation, curled in fetal position  Pulmonary:     Effort: Pulmonary effort is normal.  Skin:    General: Skin is warm and dry.             Vital Signs: BP (!) 148/81 (BP Location: Left Arm)   Pulse 72   Temp  98.1 F (36.7 C)   Resp 18   Ht 5\' 7"  (1.702 m) Comment: stated by the patient  Wt 48.8 kg   SpO2 100%   BMI 16.85 kg/m  SpO2: SpO2: 100 % O2 Device: O2 Device: Nasal Cannula O2 Flow Rate: O2 Flow Rate (L/min): 2 L/min  Intake/output summary: No intake or output data in the 24 hours ending 10/22/21 1008 LBM: Last BM Date : 10/17/21 Baseline Weight: Weight: 53.1 kg (bed weight) Most recent weight: Weight: 48.8 kg       Palliative Assessment/Data: PPS 10%      Patient Active Problem List   Diagnosis Date Noted   Hospice care patient 10/22/2021   Malnutrition of moderate degree 10/20/2021   Pressure injury of skin 10/16/2021   Hypoglycemia 10/14/2021   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[place]) 10/14/2021   Coronary artery disease 07/10/2020   Dementia with behavioral disturbance ([place]) 07/10/2020   Glaucoma 07/10/2020   Hypothyroidism 07/10/2020   Prediabetes 07/10/2020   Chronic kidney disease (CKD) stage G3a/A1, moderately decreased glomerular filtration rate (GFR) between 45-59 mL/min/1.73 square meter and albuminuria creatinine ratio less than 30 mg/g ([place]) 07/10/2020   AMS (altered mental status) 07/10/2020   Encephalopathy chronic 06/22/2018   Altered mental status 06/17/2018   Former tobacco  use 12/04/2017   Essential hypertension 07/11/2017   CVA (cerebral vascular accident) ([place]) 07/11/2017   Hyperlipidemia 07/11/2017   Personal history of transient ischemic attack (TIA), 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 residual deficits 07/11/2017    Palliative Care Assessment & Plan   HPI: 81 y.o. female  with past medical history of CAD, hypertension, and CVA admitted on 10/14/2021 with AMS.  Found to have UTI which has been treated.  Unfortunately continues with altered mental status and very poor p.o. intake.  Patient having episodes of hypoglycemia throughout hospitalization.  PMT consulted to discuss goals of care with family.  Assessment: Follow-up today with  patient. No family at bedside. Plans today to discharge back to long-term care facility with hospice to follow. Appears that mental status this slightly worse today and she was unable to take in any p.o.'s this morning. No signs of pain or suffering.  Recommendations/Plan: Likely discharge today to long-term care facility with hospice Appears comfortable Comfort measures only  Goals of Care and Additional Recommendations: Limitations on Scope of Treatment: Full Comfort Care  Code Status: DNR  Prognosis:  < 2 weeks  Discharge Planning: [place] with Hospice  Care plan was discussed with nursing staff  Thank you for allowing the Palliative Medicine Team to assist in the care of this patient.   *Please note that this is a verbal dictation therefore any spelling or grammatical errors are due to the "[place] [employer]" system interpretation.  Juel Burrow, DNP, [facility] Palliative Medicine Team Team Phone # [phone]  Pager [phone]

## 2021-10-22 NOTE — Progress Notes (Signed)
Called report to [facility].  Gave report to RN.  AVS packet sent with EMS to facility.

## 2021-10-22 NOTE — Progress Notes (Signed)
Pt blood sugar is 80

## 2021-10-22 NOTE — Discharge Summary (Addendum)
Physician Discharge Summary   Patient: Sara Bailey MRN: [id number] DOB: 08-25-1940  Admit date:     10/14/2021  Discharge date: 10/22/21  Discharge Physician: Sara Bailey   PCP: Sara Morin, MD   Recommendations at discharge:    Follow up with Hospice team upon returning to facility  Discharge Diagnoses: Principal Problem:   Hypoglycemia Active Problems:   Hospice care patient   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Sara Bailey)   Coronary artery disease   Essential hypertension   Hypothyroidism   Pressure injury of skin   Malnutrition of moderate degree  Resolved Problems:   Acute lower UTI   Hypernatremia  Hospital Course: Sara Bailey was admitted to the hospital with the working diagnosis of altered mental status due to urinary tract infection, complicated with hypoglycemia.   81 yo female with the past medical history of coronary artery disease, CVA and hypertension who presented with altered mental status. Noted decline in her mentation over last few days prior to her transfer from the nursing home. On the day of her transfer she was found hypoglycemic and was decided to transfer her to the hospital. On her initial physical examination her blood pressure was 126/66, HR 47, RR 12 and 02 saturation 97,9. Lungs with no wheezing or rales, heart with S1 and S2 present and rhythmic, abdomen with no distention, no lower extremity edema.   NA 153, K 4,1 Cl 118 bicarbonate 39 glucose 342 bun 27 cr 1,35  Lactic acid 2,9  Wbc 5,7 hgb 11,1 plt 135  Urine analysis with SG 1,023, 21-50 wbc, 6-10 rbc  Urine culture >100,000 CFU aerococcus   Head CT with no acute changes  Chest radiograph with hyperinflation with no infiltrates.   EKG 42 bpm, left axis deviation, left anterior fascicular block, right bundle branch block, qtc 522, sinus rhythm with no significant ST segment or T wave changes.   Patient was placed on antibiotic therapy for urinary tract infection and  clinically improved.   Hospital course complicated by hypoglycemia requiring dextrose IV fluids, in the setting of very poor PO intake.   Monitored off of D5w fluids, glucose readings have been in 70's to 80's for the most part.  Oral intake remains minimal.  Patient mostly sleeps and has demonstrated almost no interaction with staff.    Goals of care were discussed with family and decision made to have patient return to [facility] with hospice following.  Patient was started on comfort measures on afternoon of 9/21.    Patient is stable for discharge today 9/22.   Assessment and Plan: * Hypoglycemia CBG's improved on D5W infusion, but patient has ongoing poor and minimal PO intake.     Patient was taken off D5W to monitor for recurrent hypoglycemic episodes - sugars have been 70's to 80's.  SLP for swallow evaluation - dysphagia 1 (pureed) diet. Aspiration precautions  Palliative consulted given underlying dementia which seems advanced.   --Pt is now on comfort care --No further CBG's needed  Hospice care patient Patient was previously followed by outpatient palliative at her LTC facility.  Now on comfort care measures due to ongoing poor PO intake and progression of dementia. --Comfort care medications per orders --Notify provider if any signs of pain, distress or discomfort  Acute lower UTI-resolved as of 10/22/2021 Patient has completed antibiotic therapy with clinical improvement.    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Sara Bailey) No behavioral disturbances noted. -- Continue with divalproex, donepezil, fluoxetine, memantine and mirtazapine.  --As needed lorazepam.  Coronary artery disease D/c aspirin and atorvastatin, on comfort care. Patient with no signs chest pain.   Essential hypertension We held off antihypertensives due to risk of hypotension. Meds discontinued.  Hypernatremia-resolved as of 10/22/2021 Resolved.   Na initially was 153 - due to dehydration, free  water deficit.  Improved with D5w infusion.  Hypothyroidism D/c levothyroxine, on comfort care   Pressure injury of skin Frequently reposition patient to offload area. Monitor for signs of irritation or infection.   Pressure Ulcer  Duration          Pressure Injury 10/14/21 Sacrum Medial Stage 1 -  Intact skin with non-blanchable redness of a localized area usually over a bony prominence. Healing stage 1 with thick skin 3 days            Malnutrition of moderate degree Related to chronic illnesses including prior strokes and dementia, as evidenced by fat and muscle depletion, weight loss --Appreciate dietitian recommendations -- Started on supplement drinks vitamins.   Acute respiratory failure with hypoxia - clinically undetermined.  No documented vitals show true hypoxia.      Consultants: Palliative care Procedures performed: none   Disposition: Long term care facility with hospice  Diet recommendation: Dysphagia type 1 thin Liquid      DISCHARGE MEDICATION: Allergies as of 10/22/2021   No Known Allergies      Medication List     STOP taking these medications    amLODipine 10 MG tablet Commonly known as: NORVASC   aspirin 81 MG chewable tablet   atorvastatin 80 MG tablet Commonly known as: LIPITOR   cholecalciferol 25 MCG (1000 UNIT) tablet Commonly known as: VITAMIN D3   hydrochlorothiazide 12.5 MG tablet Commonly known as: HYDRODIURIL   levothyroxine 75 MCG tablet Commonly known as: SYNTHROID   metoprolol succinate 25 MG 24 hr tablet Commonly known as: TOPROL-XL   multivitamin capsule   potassium chloride 10 MEQ tablet Commonly known as: KLOR-CON M   senna 8.6 MG tablet Commonly known as: SENOKOT       TAKE these medications    acetaminophen 325 MG tablet Commonly known as: TYLENOL Take 2 tablets (650 mg total) by mouth every 6 (six) hours as needed for mild pain (or Fever >/= 101).   acetaminophen 650 MG  suppository Commonly known as: TYLENOL Place 1 suppository (650 mg total) rectally every 6 (six) hours as needed for mild pain (or Fever >/= 101).   antiseptic oral rinse Liqd Apply 15 mLs topically 3 (three) times daily.   divalproex 125 MG capsule Commonly known as: DEPAKOTE SPRINKLE Take 125 mg by mouth. 125 mg in the morning and 250 mg in the evening   donepezil 5 MG tablet Commonly known as: ARICEPT Take 10 mg by mouth at bedtime.   dorzolamide 2 % ophthalmic solution Commonly known as: TRUSOPT Place 1 drop into both eyes 3 (three) times daily.   feeding supplement Liqd Take 237 mLs by mouth 2 (two) times daily between meals.   FLUoxetine 10 MG capsule Commonly known as: PROZAC Take 10 mg by mouth daily.   glycopyrrolate 1 MG tablet Commonly known as: ROBINUL Take 1 tablet (1 mg total) by mouth every 4 (four) hours as needed (excessive secretions).   glycopyrrolate 0.2 MG/ML injection Commonly known as: ROBINUL Inject 1 mL (0.2 mg total) into the skin every 4 (four) hours as needed (excessive secretions).   haloperidol 2 MG tablet Commonly known as: HALDOL Take 1 tablet (2 mg total) by mouth  every 4 (four) hours as needed for agitation (or delirium).   haloperidol 2 MG/ML solution Commonly known as: HALDOL Place 1 mL (2 mg total) under the tongue every 4 (four) hours as needed for agitation (or delirium).   latanoprost 0.005 % ophthalmic solution Commonly known as: XALATAN Apply to eye.   LORazepam 1 MG tablet Commonly known as: ATIVAN Take 1 tablet (1 mg total) by mouth every 4 (four) hours as needed for anxiety. What changed:  medication strength how much to take when to take this reasons to take this   LORazepam 2 MG/ML concentrated solution Commonly known as: ATIVAN Place 0.5 mLs (1 mg total) under the tongue every 4 (four) hours as needed for anxiety. What changed: You were already taking a medication with the same name, and this prescription was  added. Make sure you understand how and when to take each.   memantine 10 MG tablet Commonly known as: NAMENDA Take 10 mg by mouth 2 (two) times daily.   mirtazapine 15 MG tablet Commonly known as: REMERON Take 15 mg by mouth at bedtime. What changed: Another medication with the same name was removed. Continue taking this medication, and follow the directions you see here.   ondansetron 4 MG tablet Commonly known as: ZOFRAN Take 1 tablet (4 mg total) by mouth every 6 (six) hours as needed for nausea.   polyvinyl alcohol 1.4 % ophthalmic solution Commonly known as: LIQUIFILM TEARS Place 1 drop into both eyes 4 (four) times daily as needed for dry eyes.   Senna-S 8.6-50 MG tablet Generic drug: senna-docusate Take 1 tablet by mouth 2 (two) times daily.   Simbrinza 1-0.2 % Susp Generic drug: Brinzolamide-Brimonidine Place 1 drop into both eyes 3 (three) times daily.               Discharge Care Instructions  (From admission, onward)           Start     Ordered   10/22/21 0000  Discharge wound care:       Comments: Frequent reposition patient.  Monitor for signs of irritation or infection.   10/22/21 0816            Contact information for after-discharge care     Destination     [facility] .   Service: Skilled Nursing Contact information: [address] [place] [phone]                    Discharge Exam: Filed Weights   10/14/21 1200 10/14/21 2004  Weight: 53.1 kg 48.8 kg   General exam: awake, lying on right side, no acute distress Respiratory system: CTAB diminished bases, no wheezes, normal respiratory effort. Cardiovascular system: normal S1/S2, RRR, no pedal edema.   Gastrointestinal system: soft, NT, ND Central nervous system: exam limited, pt non-verbal, does not follow commands due to advanced dementia Extremities: no edema, normal tone Skin: dry, intact, normal  temperature Psychiatry: unable to assess mood, pt does not interact or follow commands   Condition at discharge: stable  The results of significant diagnostics from this hospitalization (including imaging, microbiology, ancillary and laboratory) are listed below for reference.   Imaging Studies: DG Chest Port 1 View  Result Date: 10/17/2021 CLINICAL DATA:  Edema,medical history significant for coronary artery disease, hypertension and CVA, who presented to the emergency room with acute onset of decreased responsiveness at her [facility] skilled nursing facility where she resides. EXAM: PORTABLE CHEST  1 VIEW COMPARISON:  10/14/2021 and older exams. FINDINGS: Cardiac silhouette is normal in size and configuration. No mediastinal or hilar masses. Hazy opacity noted at the right lung base new since the prior exam. Remainder of the lungs is clear. No pleural effusion or pneumothorax. Skeletal structures are grossly intact. IMPRESSION: 1. Subtle hazy opacity at the right lung base developed since the prior exam. Consider pneumonia if there are consistent clinical findings. Electronically Signed   By: Amie Portland M.D.   On: 10/17/2021 13:49   CT HEAD WO CONTRAST ( )  Result Date: 10/14/2021 CLINICAL DATA:  Altered mental status for 2-3 days. EXAM: CT HEAD WITHOUT CONTRAST TECHNIQUE: Contiguous axial images were obtained from the base of the skull through the vertex without intravenous contrast. RADIATION DOSE REDUCTION: This exam was performed according to the departmental dose-optimization program which includes automated exposure control, adjustment of the mA and/or kV according to patient size and/or use of iterative reconstruction technique. COMPARISON:  Head CT 08/10/2021 FINDINGS: Brain: There is no acute intracranial hemorrhage, extra-axial fluid collection, or acute infarct. Background parenchymal volume loss with prominence of the ventricular system and extra-axial CSF spaces is unchanged.  Confluent hypodensity in the supratentorial white matter likely reflecting sequela of advanced chronic small vessel ischemic change is stable. 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 There is no mass lesion.  There is no mass effect or midline shift. Vascular: There is calcification of the bilateral carotid siphons and vertebral arteries. Skull: Normal. Negative for fracture or focal lesion. Sinuses/Orbits: The imaged paranasal sinuses are clear. The imaged globes and orbits are unremarkable. Other: None. IMPRESSION: Stable noncontrast head CT with no acute intracranial pathology. Electronically Signed   By: Lesia Hausen M.D.   On: 10/14/2021 11:00   DG Chest Portable 1 View  Result Date: 10/14/2021 CLINICAL DATA:  Altered mental status, now less responsive EXAM: PORTABLE CHEST 1 VIEW COMPARISON:  07/10/2020 chest radiograph FINDINGS: No pleural effusion. No pneumothorax. Unchanged cardiac and mediastinal contours. No focal airspace opacity. No acute osseous abnormality. Visualized upper abdomen is unremarkable. There are mild calcifications of the aortic arch. IMPRESSION: No focal airspace opacity Electronically Signed   By: Lorenza Cambridge M.D.   On: 10/14/2021 08:53    Microbiology: Results for orders placed or performed during the hospital encounter of 10/14/21  Blood culture (single)     Status: None   Collection Time: 10/14/21 10:20 AM   Specimen: BLOOD  Result Value Ref Range Status   Specimen Description BLOOD RIGHT ARM  Final   Special Requests   Final    BOTTLES DRAWN AEROBIC AND ANAEROBIC Blood Culture adequate volume   Culture   Final    NO GROWTH 5 DAYS Performed at [facility], [address], [place], [place] [address]    Report Status 10/19/2021 FINAL  Final  Urine Culture     Status: Abnormal   Collection Time: 10/14/21 11:33 AM   Specimen: Urine, Clean Catch  Result Value Ref Range Status   Specimen Description URINE, CLEAN CATCH  Final   Special  Requests NONE  Final   Culture >=100,000 COLONIES/mL AEROCOCCUS URINAE (A)  Final   Report Status 10/17/2021 FINAL  Final  MRSA Next Gen by PCR, Nasal     Status: None   Collection Time: 10/18/21  4:40 AM   Specimen: Nasal Mucosa; Nasal Swab  Result Value Ref Range Status   MRSA by PCR Next Gen NOT DETECTED NOT DETECTED Final  Comment: (NOTE) The GeneXpert MRSA Assay (FDA approved for NASAL specimens only), is one component of a comprehensive MRSA colonization surveillance program. It is not intended to diagnose MRSA infection nor to guide or monitor treatment for MRSA infections. Test performance is not FDA approved in patients less than 4 years old. Performed at [facility], [address] [place], [place] [address]     Labs: CBC: Recent Labs  Lab 10/16/21 0329 10/17/21 0252 10/18/21 1006  WBC 5.8 5.5 6.7  HGB 10.8* 10.3* 11.2*  HCT 34.5* 32.8* 34.8*  MCV 93.8 94.0 93.3  PLT 119* 129* [id number]   Basic Metabolic Panel: Recent Labs  Lab 10/16/21 0329 10/16/21 0803 10/16/21 1141 10/16/21 1826 10/16/21 2212 10/17/21 0252 10/19/21 0522 10/20/21 0512  NA 141 139   < > 141 138 139 140 138  K 3.1*  --   --   --   --  3.9 4.3 4.0  CL 109  --   --   --   --  108 105 108  CO2 26  --   --   --   --  27 29 27   GLUCOSE 94 98   < > 110* 117* 101* 136* 142*  BUN 9  --   --   --   --  10 17 12   CREATININE 0.89  --   --   --   --  0.87 0.83 0.68  CALCIUM 9.0  --   --   --   --  9.0 9.4 9.7  MG  --  1.8  --   --   --  1.8  --   --    < > = values in this interval not displayed.   Liver Function Tests: No results for input(s): "AST", "ALT", "ALKPHOS", "BILITOT", "PROT", "ALBUMIN" in the last 168 hours. CBG: Recent Labs  Lab 10/21/21 0742 10/21/21 1145 10/21/21 1652 10/22/21 0627 10/22/21 0728  GLUCAP 78 87 70 80 77    Discharge time spent: greater than 30 minutes.  Signed: Ezekiel Slocumb, DO [facility] 10/22/2021

## 2021-10-22 NOTE — Assessment & Plan Note (Signed)
Patient was previously followed by outpatient palliative at her LTC facility.  Now on comfort care measures due to ongoing poor PO intake and progression of dementia. --Comfort care medications per orders --Notify provider if any signs of pain, distress or discomfort

## 2021-12-01 DEATH — deceased

## 2023-05-31 IMAGING — DX DG ORBITS FOR FOREIGN BODY
2 series · 2 of 2 positions shown · non-contrast
Comparison: None.

CLINICAL DATA: Clearance for MRI

EXAM:
ORBITS FOR FOREIGN BODY - 2 VIEW

[skull waters (1 of 2)]
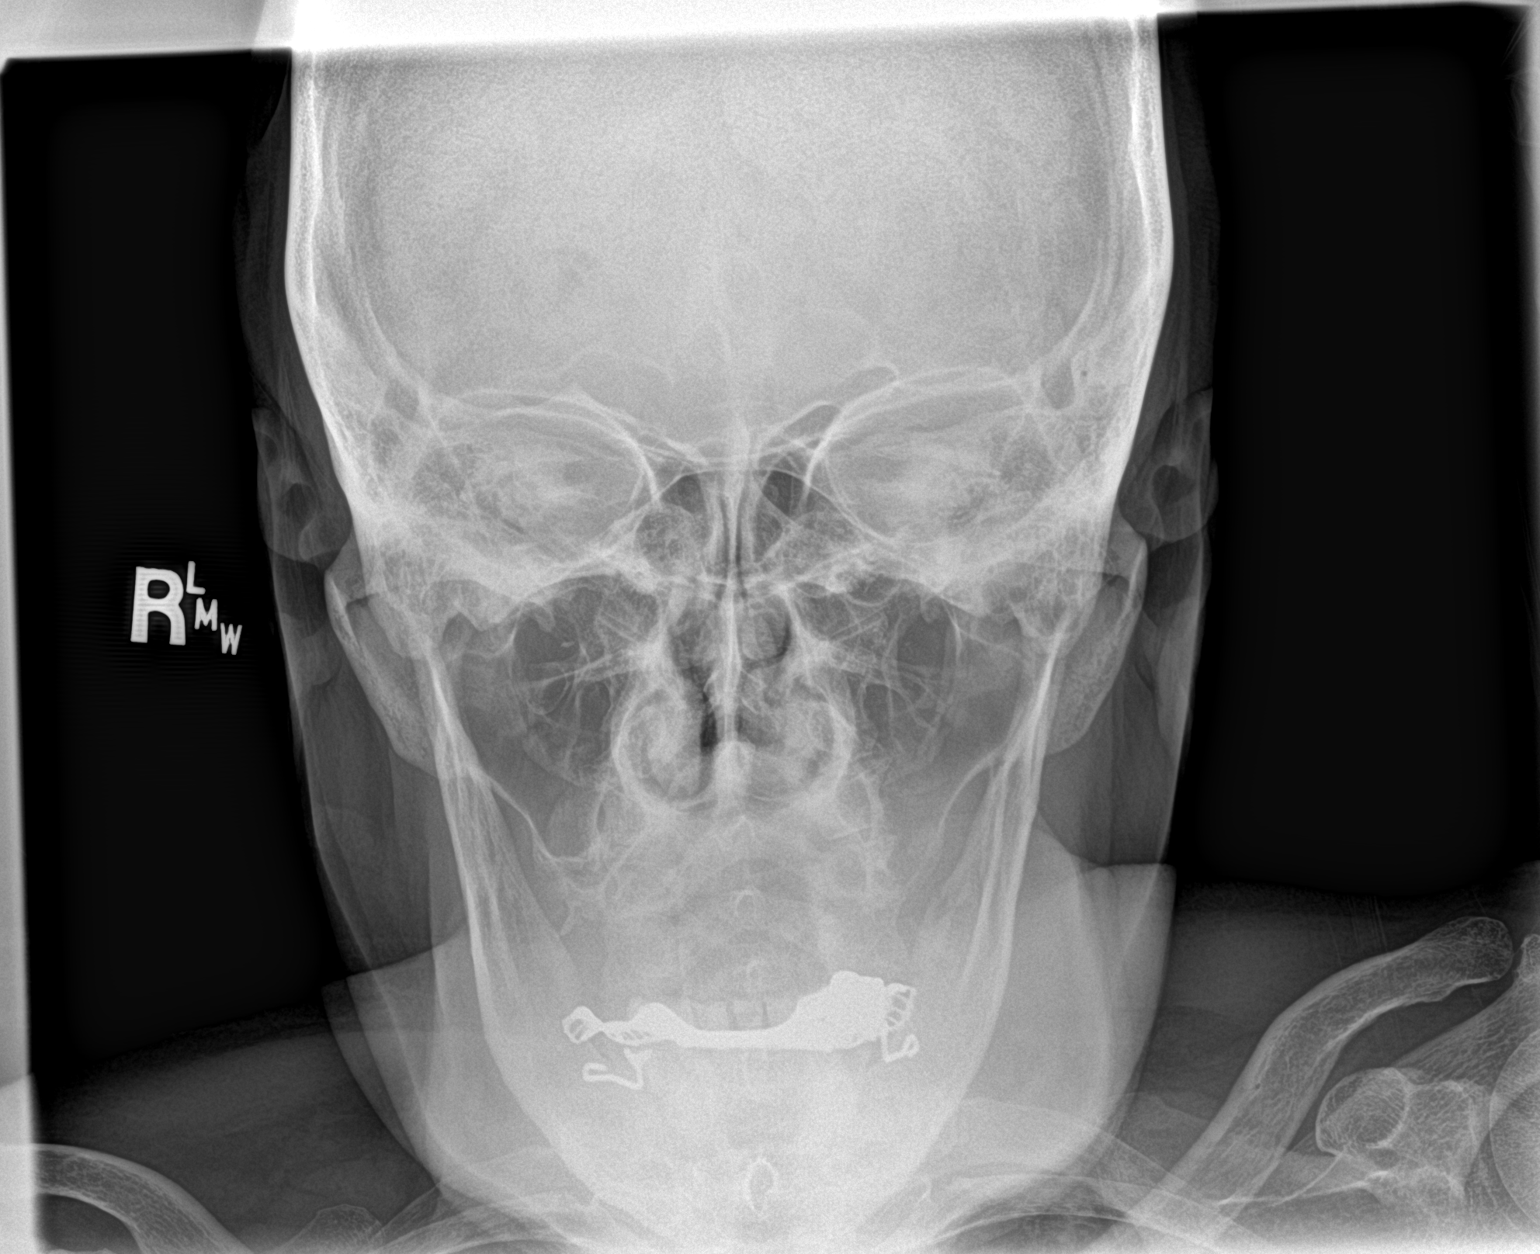

[skull waters (2 of 2)]
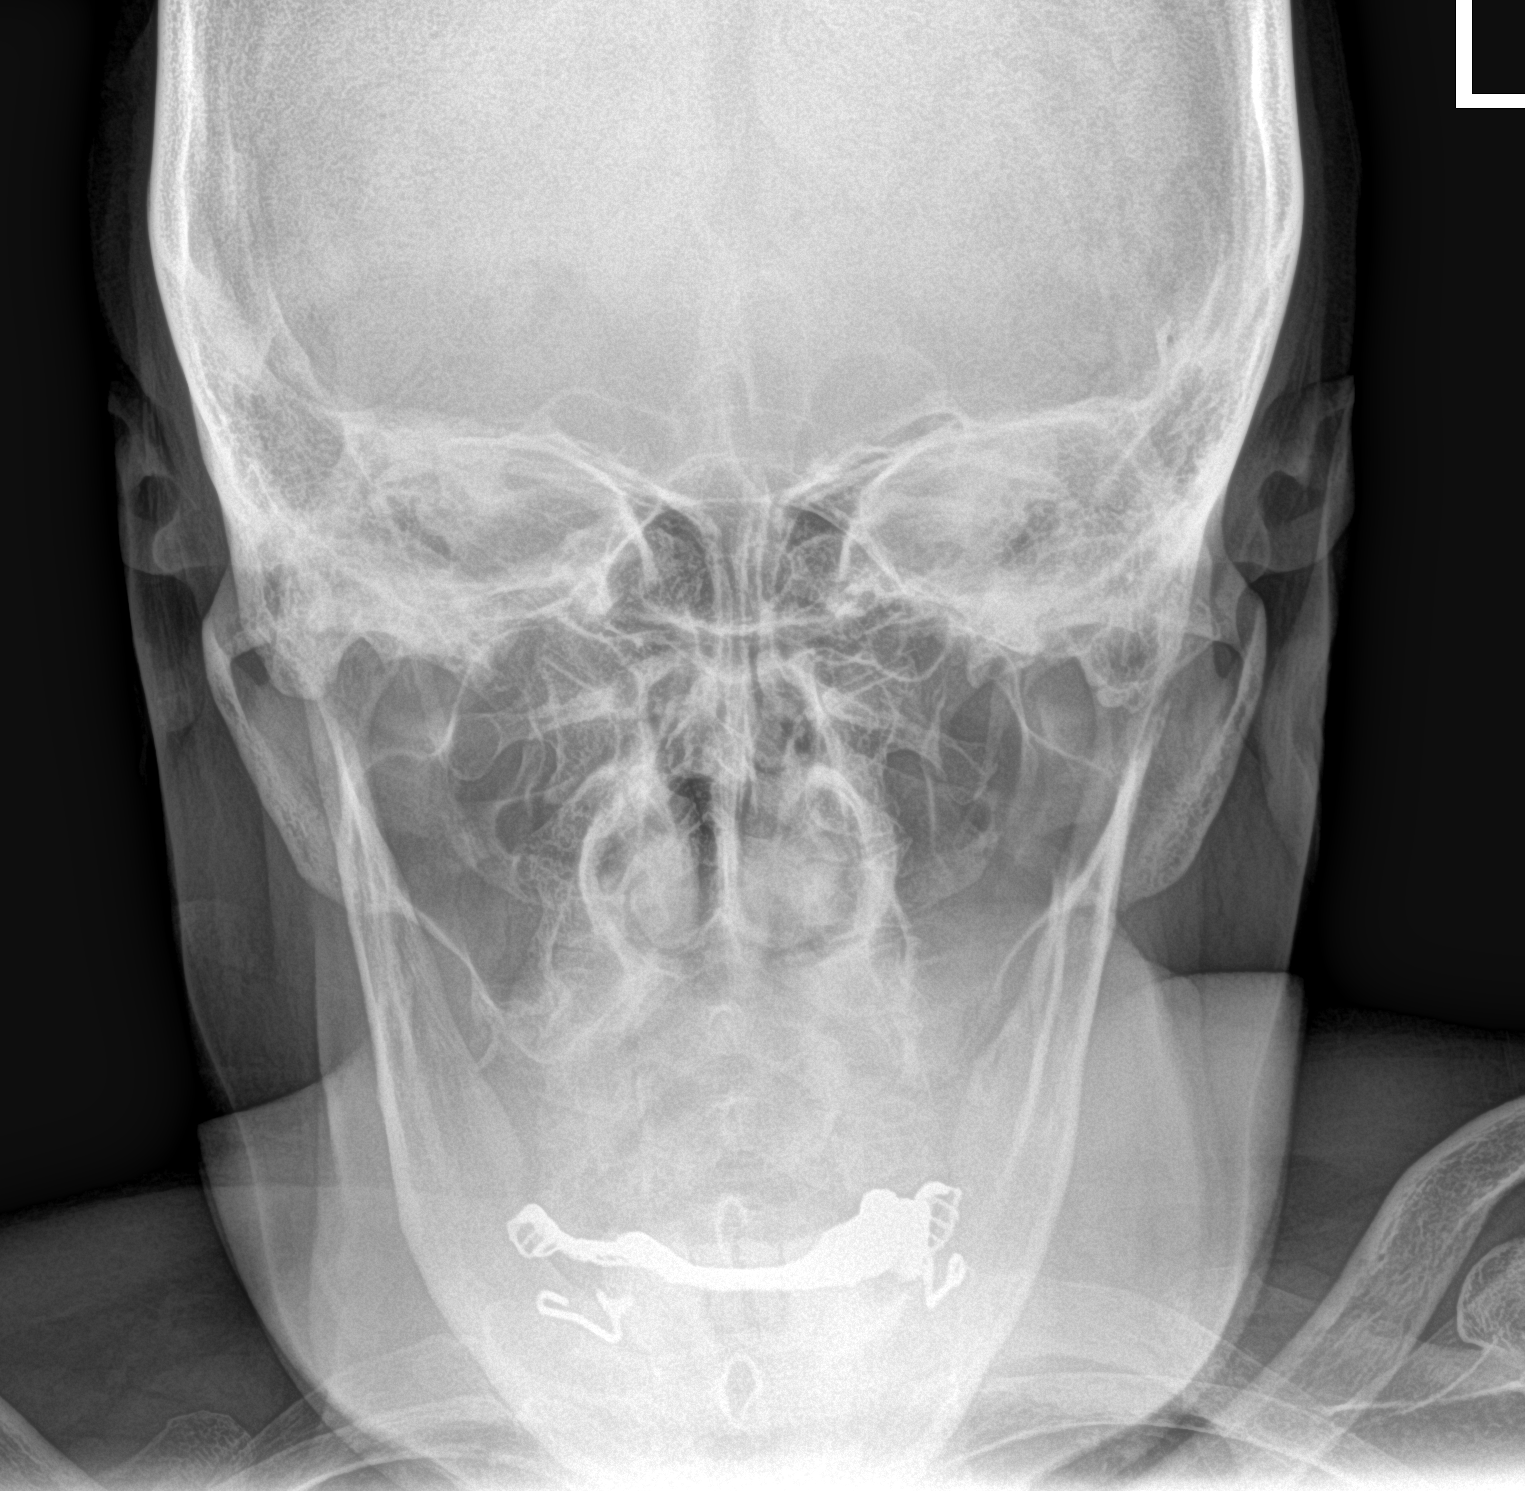

[2 of 2 positions shown; findings below may reference images not displayed]

FINDINGS: There is no evidence of metallic foreign body within the orbits. No
significant bone abnormality identified.
IMPRESSION: No evidence of metallic foreign body within the orbits.
